# Patient Record
Sex: Female | Born: 1955 | Race: White | Hispanic: No | Smoking: Never smoker
Health system: Southern US, Community
[De-identification: ages and names within clinical notes are randomized; demographics above are authoritative.]

## PROBLEM LIST (undated history)

## (undated) DIAGNOSIS — R001 Bradycardia, unspecified: Secondary | ICD-10-CM

## (undated) DIAGNOSIS — R011 Cardiac murmur, unspecified: Secondary | ICD-10-CM

## (undated) DIAGNOSIS — I350 Nonrheumatic aortic (valve) stenosis: Secondary | ICD-10-CM

## (undated) DIAGNOSIS — K219 Gastro-esophageal reflux disease without esophagitis: Secondary | ICD-10-CM

## (undated) DIAGNOSIS — M1990 Unspecified osteoarthritis, unspecified site: Secondary | ICD-10-CM

## (undated) HISTORY — DX: Gastro-esophageal reflux disease without esophagitis: K21.9

---

## 1999-12-14 ENCOUNTER — Encounter: Payer: Self-pay | Admitting: Family Medicine

## 1999-12-14 ENCOUNTER — Encounter: Admission: RE | Admit: 1999-12-14 | Discharge: 1999-12-14 | Payer: Self-pay | Admitting: Family Medicine

## 2001-04-04 ENCOUNTER — Encounter: Admission: RE | Admit: 2001-04-04 | Discharge: 2001-04-04 | Payer: Self-pay | Admitting: *Deleted

## 2005-06-13 HISTORY — PX: REPLACEMENT TOTAL KNEE: SUR1224

## 2007-03-21 ENCOUNTER — Encounter: Admission: RE | Admit: 2007-03-21 | Discharge: 2007-03-21 | Payer: Self-pay | Admitting: Family Medicine

## 2008-04-21 ENCOUNTER — Encounter: Admission: RE | Admit: 2008-04-21 | Discharge: 2008-04-21 | Payer: Self-pay | Admitting: Internal Medicine

## 2008-12-19 ENCOUNTER — Inpatient Hospital Stay (HOSPITAL_COMMUNITY): Admission: RE | Admit: 2008-12-19 | Discharge: 2008-12-22 | Payer: Self-pay | Admitting: Orthopedic Surgery

## 2009-04-29 ENCOUNTER — Encounter: Admission: RE | Admit: 2009-04-29 | Discharge: 2009-04-29 | Payer: Self-pay | Admitting: Internal Medicine

## 2010-05-14 ENCOUNTER — Encounter: Admission: RE | Admit: 2010-05-14 | Discharge: 2010-05-14 | Payer: Self-pay | Admitting: Internal Medicine

## 2010-07-04 ENCOUNTER — Encounter: Payer: Self-pay | Admitting: Obstetrics and Gynecology

## 2010-08-06 IMAGING — CR DG KNEE 1-2V PORT*R*
2 series · 2 of 2 positions shown · non-contrast
Comparison: None.

CLINICAL DATA: Osteoarthritis, right total knee arthroplasty

PORTABLE RIGHT KNEE - 1-2 VIEW

[view not recorded (1 of 2)]
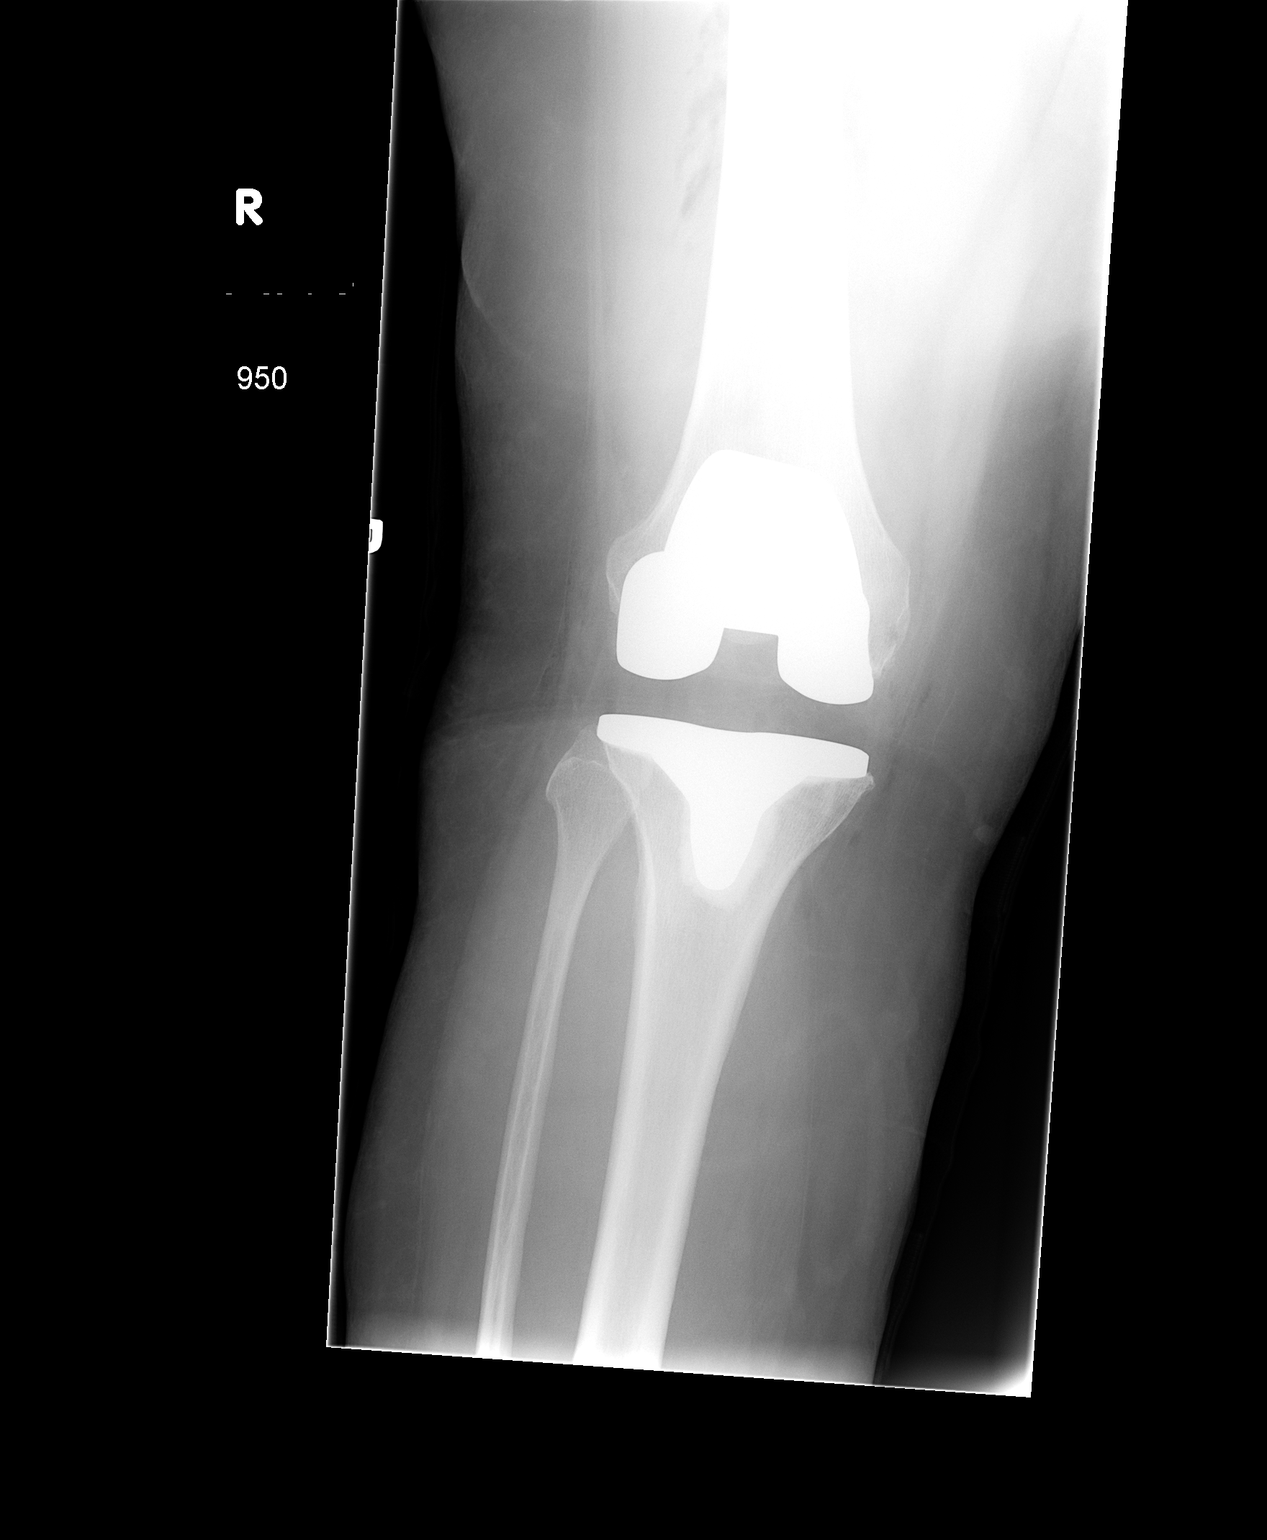

[view not recorded (2 of 2)]
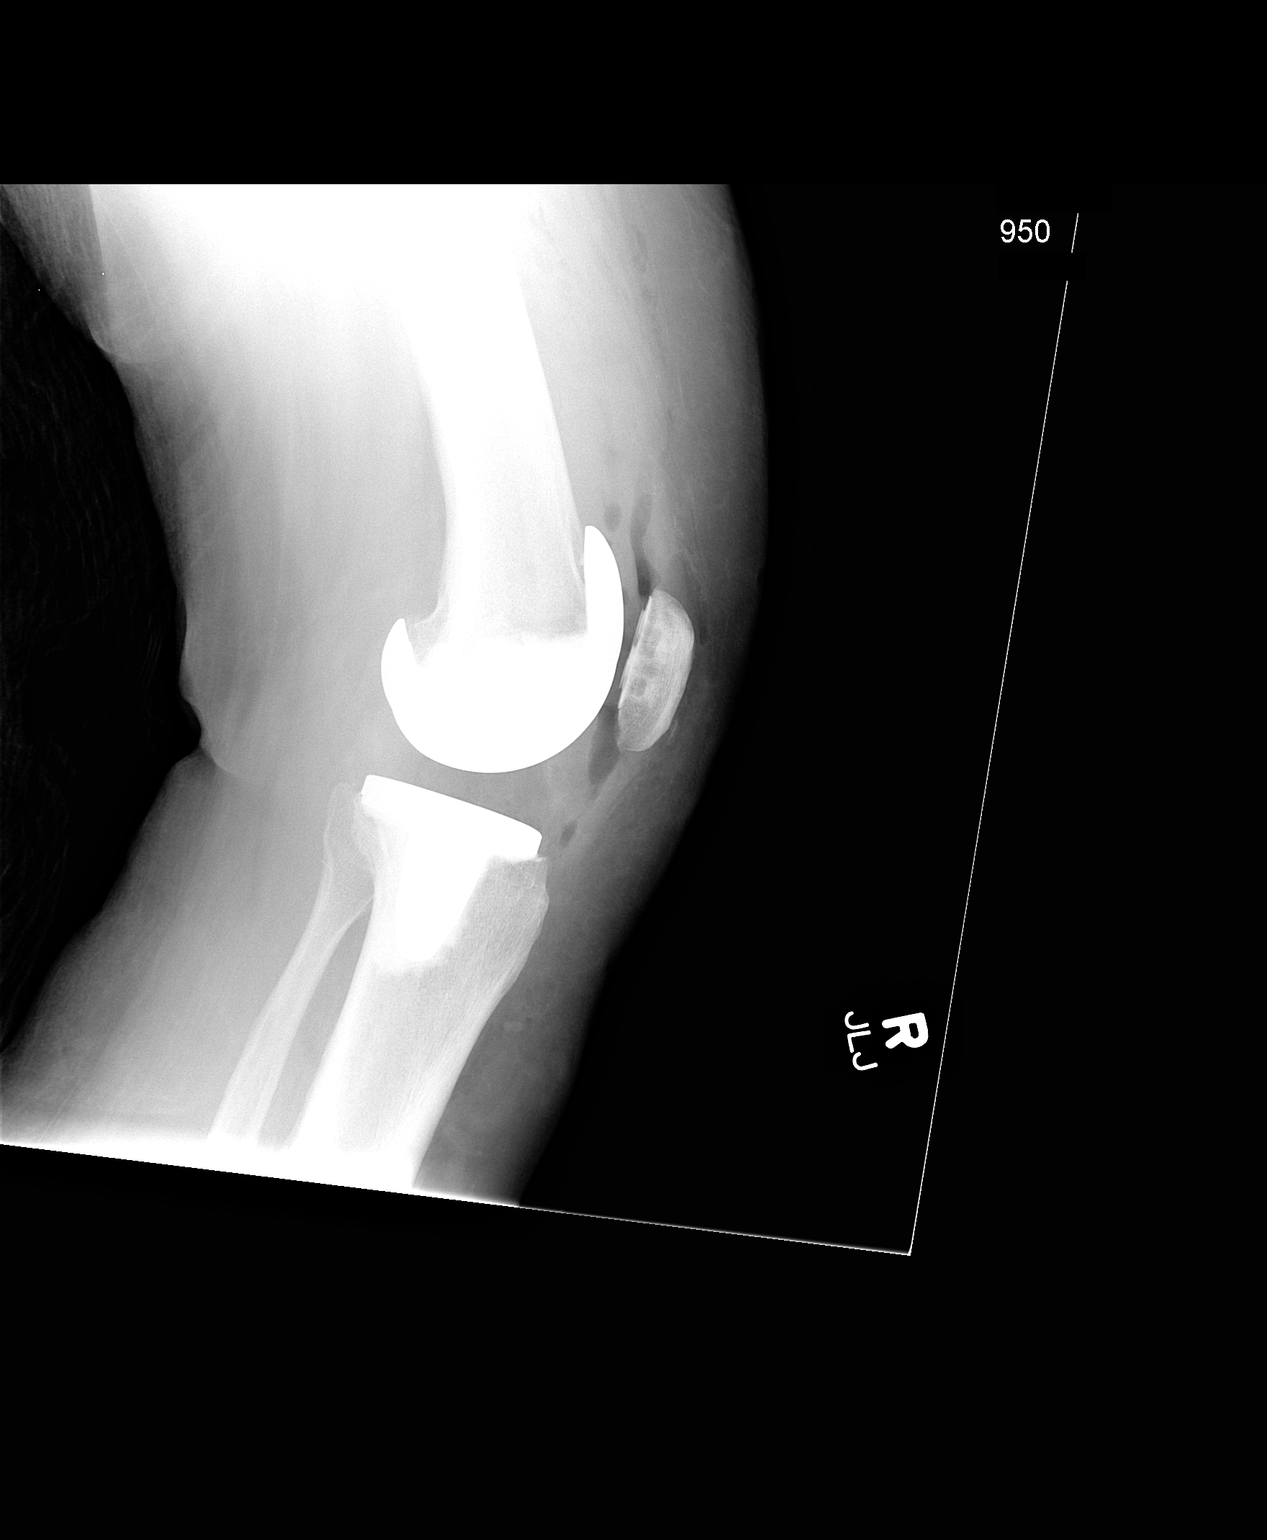

[2 of 2 positions shown; findings below may reference images not displayed]

FINDINGS: Portable two-view exam demonstrates bipolar arthroplasty
of the right knee.  Diffuse soft tissue swelling.  Air within the
joint space.  Normal alignment.  No fracture.  Expected
postoperative appearance.
IMPRESSION: Status post right total knee arthroplasty.

## 2010-09-19 LAB — BASIC METABOLIC PANEL
BUN: 6 mg/dL (ref 6–23)
CO2: 27 mEq/L (ref 19–32)
CO2: 28 mEq/L (ref 19–32)
CO2: 28 mEq/L (ref 19–32)
Chloride: 105 mEq/L (ref 96–112)
Chloride: 106 mEq/L (ref 96–112)
Chloride: 108 mEq/L (ref 96–112)
Creatinine, Ser: 0.72 mg/dL (ref 0.4–1.2)
Creatinine, Ser: 0.86 mg/dL (ref 0.4–1.2)
GFR calc Af Amer: >60 mL/min (ref >60)
GFR calc Af Amer: >60 mL/min (ref >60)
GFR calc Af Amer: >60 mL/min (ref >60)
GFR calc non Af Amer: >60 mL/min (ref >60)
GFR calc non Af Amer: >60 mL/min (ref >60)
Glucose, Bld: 108 mg/dL — ABNORMAL HIGH (ref 70–99)
Glucose, Bld: 146 mg/dL — ABNORMAL HIGH (ref 70–99)
Potassium: 3.6 mEq/L (ref 3.5–5.1)
Potassium: 3.9 mEq/L (ref 3.5–5.1)
Potassium: 4.3 mEq/L (ref 3.5–5.1)
Sodium: 135 mEq/L (ref 135–145)
Sodium: 139 mEq/L (ref 135–145)

## 2010-09-19 LAB — CBC
HCT: 31.6 % — ABNORMAL LOW (ref 36.0–46.0)
HCT: 33.1 % — ABNORMAL LOW (ref 36.0–46.0)
HCT: 35.8 % — ABNORMAL LOW (ref 36.0–46.0)
Hemoglobin: 11.4 g/dL — ABNORMAL LOW (ref 12.0–15.0)
Hemoglobin: 12 g/dL (ref 12.0–15.0)
MCHC: 33.5 g/dL (ref 30.0–36.0)
MCHC: 33.8 g/dL (ref 30.0–36.0)
MCHC: 34.5 g/dL (ref 30.0–36.0)
MCV: 94.6 fL (ref 78.0–100.0)
MCV: 95.2 fL (ref 78.0–100.0)
MCV: 95.3 fL (ref 78.0–100.0)
Platelets: 144 10*3/uL — ABNORMAL LOW (ref 150–400)
RBC: 3.32 MIL/uL — ABNORMAL LOW (ref 3.87–5.11)
RBC: 3.76 MIL/uL — ABNORMAL LOW (ref 3.87–5.11)
RBC: 4.31 MIL/uL (ref 3.87–5.11)
RDW: 13.4 % (ref 11.5–15.5)
RDW: 13.5 % (ref 11.5–15.5)
WBC: 5.1 10*3/uL (ref 4.0–10.5)
WBC: 7.2 10*3/uL (ref 4.0–10.5)

## 2010-09-19 LAB — URINE MICROSCOPIC-ADD ON

## 2010-09-19 LAB — URINALYSIS, ROUTINE W REFLEX MICROSCOPIC
Nitrite: NEGATIVE
Specific Gravity, Urine: 1.015 (ref 1.005–1.030)
Urobilinogen, UA: 0.2 mg/dL (ref 0.0–1.0)

## 2010-09-19 LAB — DIFFERENTIAL
Eosinophils Absolute: 0.2 10*3/uL (ref 0.0–0.7)
Lymphocytes Relative: 36 % (ref 12–46)
Lymphs Abs: 1.9 10*3/uL (ref 0.7–4.0)
Monocytes Relative: 8 % (ref 3–12)
Neutro Abs: 2.6 10*3/uL (ref 1.7–7.7)
Neutrophils Relative %: 50 % (ref 43–77)

## 2010-09-19 LAB — PROTIME-INR
INR: 0.9 (ref 0.00–1.49)
Prothrombin Time: 14.5 seconds (ref 11.6–15.2)

## 2010-09-19 LAB — APTT: aPTT: 22 seconds — ABNORMAL LOW (ref 24–37)

## 2010-10-26 NOTE — Op Note (Signed)
Kaitlyn Maynard, Kaitlyn Maynard            ACCOUNT NO.:  [id number]   MEDICAL RECORD NO.:  [id number]          PATIENT TYPE:  INP   LOCATION:  0001                         FACILITY:  [facility]   PHYSICIAN:  Almedia Balls. Ranell Patrick, M.D. DATE OF BIRTH:  03/07/1956   DATE OF PROCEDURE:  12/19/2008  DATE OF DISCHARGE:                               OPERATIVE REPORT   PREOPERATIVE DIAGNOSIS:  Right knee end-stage osteoarthritis.   POSTOPERATIVE DIAGNOSIS:  Right knee end-stage osteoarthritis.   PROCEDURE PERFORMED:  Right total knee replacement using DePuy segmental  rotating platform prosthesis.   SURGEON:  Almedia Balls. Ranell Patrick, M.D.   ASSISTANT:  Donnie Coffin. Durwin Nora, P.A.   ANESTHESIA:  Spinal anesthesia was used.   ESTIMATED BLOOD LOSS:  Was minimal.   TOURNIQUET TIME:  75 minutes at 350 mmHg.   FLUID REPLACEMENT:  2300 mL crystalloid.   URINE OUTPUT:  375 mL.   COUNTS:  Correct.   COMPLICATIONS:  None.  Perioperative antibiotics were given.   INDICATIONS:  The patient is a 55 year old female with worsening right  knee pain secondary to osteoarthritis.  The patient has failed  conservative management and presents now desiring operative treatment to  restore function and eliminate pain.  Informed consent was obtained.   DESCRIPTION OF PROCEDURE:  After an adequate level of anesthesia was  achieved the patient was positioned supine on the operating room table.  Nonsterile tourniquet was placed on right proximal thigh.  Right leg was  sterilely prepped and draped in the usual manner.  After exsanguination  of the limb using Esmarch bandage we elevated the tourniquet to 350  mmHg.  A longitudinal midline incision was created at the knee flexion  using a 10 blade scalpel.  Dissection sharply down through subcutaneous  tissues.  Median parapatellar arthrotomy created.  Lateral  patellofemoral ligaments divided.  Patella was everted.  Knee was  flexed.  Distal femur was entered using a step-cut drill.   We then  entered the distal femur with an intermedullary guide for distal femoral  resection set on 5 degrees right and 10 mm resection.  We then sized the  femur to a size 2.5 and then placed our four in one cutting block with  attention towards appropriate internal and external rotation based on  the epicondylar axis and posterior femoral condylar referencing.  We  placed our 2.5 cutting block on and performed our anterior-posterior and  chamfer cut blocks making sure that we did not notch the femur.  At this  point we divided our ACL and PCL and remaining meniscal tissue.  We then  subluxed the tibia anteriorly and cut the tibia at 90 degrees  perpendicular to the long axis of the tibia shaft with 2 mm resection  off the affected medial side.  At this point we checked our  flexion/extension gaps.  These were symmetric at at least 10 mm.  We  then removed our pins from the tibia and finished our tibial preparation  with placement of the, using the modular drill and keel punch and then  placed our trial 2.5 tibia in place.  We then  resected the extra bone  off the posterior femoral condyles and went ahead and performed our box  cut using the box cut resection guide and then impacted our 2.5 femur in  place.  We trialed with a 12.5 and 15 insert and the 15 fit perfectly.  We then resurfaced the patella starting at 22 mL thickness and going  down to about 15 mm of thickness and placing a 32 patellar button in  place after drilling the lug holes.  We had a nice patellar tracking and  excellent stability in flexion and extension with the ability to easily  gain full extension.  We then removed all trial implants.  We bone  grafted the distal end of the femur plugging the hole with available  bone.  We pulse irrigated the femur and tibia and patella and then went  ahead and cemented the components in place using DePuy high viscosity  cement.  Once the cement was allowed to harden we removed  excess cement  using quarter and curved osteotomes.  We then went ahead and trialed  again with 15 mm insert and that gave [place] excellent stability in full  extension and normal patellar tracking.  We then placed the real 15 mm  insert, thoroughly irrigated the knee and then closed the knee using #1  Vicryl suture figure-of-eight for the parapatellar arthrotomy, 2-0  Vicryl for subcutaneous closure and 4-0 Monocryl for skin.  Steri-Strips  applied followed by sterile dressing.  The patient tolerated the surgery  well.      Almedia Balls. Ranell Patrick, M.D.  Electronically Signed     SRN/MEDQ  D:  12/19/2008  T:  12/19/2008  Job:  [id number]

## 2010-10-26 NOTE — Discharge Summary (Signed)
Kaitlyn Maynard, Kaitlyn Maynard            ACCOUNT NO.:  [id number]   MEDICAL RECORD NO.:  [id number]          PATIENT TYPE:  INP   LOCATION:  1604                         [facility]ACILITY:  [facility]   PHYSICIAN:  Almedia Balls. Ranell Patrick, M.D. DATE OF BIRTH:  1956/04/12   DATE OF ADMISSION:  12/19/2008  DATE OF DISCHARGE:  12/22/2008                               DISCHARGE SUMMARY   ADMISSION DIAGNOSIS:  Right knee end-stage osteoarthritis.   DISCHARGE DIAGNOSES:  1. Right knee end-stage osteoarthritis.  2. Status post total knee arthroplasty.   BRIEF HISTORY:  The patient is a 55 year old female with worsening right  knee pain that has been refractory to conservative treatment.  The  patient elected for surgical management for pain relief.   PROCEDURE:  The patient had a right total knee arthroplasty by Dr.  Malon Kindle on December 19, 2008.  Assistant was [employer].  Spinal  anesthesia was used.  No complications.   HOSPITAL COURSE:  The patient admitted on December 19, 2008 for the above-  stated procedure which she tolerated well after adequate time in post  anesthesia care unit.  She was transferred up to [facility].  On postop day  #1, the patient complained of minimal pain, was able to work with her  CPM quite well.  Labs within acceptable limits.  Postop day #2, she  complained about increased pain, but it was under control in her pain  medicine.  She was actually able to work physical therapy and do some  gentle walking.  Postop day #3, she was already walking up and down the  halls.  Labs were within normal limits.  No signs of wound infection  after dressing change.  Neurovascularly , she is intact and doing quite  well.   DISCHARGE/PLAN:  The patient will be discharged home on December 22, 2008.  Her condition is stable.  Her diet is regular.  She has no known drug  allergies.   DISCHARGE MEDICATIONS:  1. Celebrex 200 mg p.o. daily.  2. Robaxin 500 mg p.o. q.6 h.  3. Coumadin per pharmacy  protocol.  4. Percocet 5/325 1-2 tablets q. 4-6 hours p.r.n. pain.   FOLLOW UP:  The patient will follow back up with Dr. Malon Kindle in  two weeks.      Thomas B. Durwin Nora, P.A.      Almedia Balls. Ranell Patrick, M.D.  Electronically Signed    TBD/MEDQ  D:  12/22/2008  T:  12/22/2008  Job:  [id number]

## 2010-10-29 NOTE — H&P (Signed)
Kaitlyn Maynard, Kaitlyn Maynard            ACCOUNT NO.:  [id number]   MEDICAL RECORD NO.:  [id number]          PATIENT TYPE:  INP   LOCATION:  NA                           FACILITY:  [facility]   PHYSICIAN:  Almedia Balls. Ranell Patrick, M.D. DATE OF BIRTH:  1956-03-14   DATE OF ADMISSION:  DATE OF DISCHARGE:                              HISTORY & PHYSICAL   CHIEF COMPLAINT:  Right knee pain.   HISTORY OF PRESENT ILLNESS:  The patient 55 year old female with  worsening right knee pain that has been refractory to conservative  treatment secondary to osteoarthritis.  The patient elected to have a  right total knee arthroplasty by Dr. Malon Kindle.   PAST MEDICAL HISTORY:  Bronchitis and osteoarthritis.   FAMILY HISTORY:  Diabetes.   SOCIAL HISTORY:  A patient of Dr. Ronne Binning.  Does not smoke or use  alcohol.   ALLERGIES:  None.   CURRENT MEDICATIONS:  Prilosec daily.   REVIEW OF SYSTEMS:  Pain with ambulation.   PHYSICAL EXAM:  Pulse 60, respirations 16, blood pressure 138/82.  The patient is a healthy-appearing 55 year old female in no acute  distress.  Pleasant mood and affect.  Alert and oriented x3.  HEAD AND NECK EXAM:  Cranial nerves II-XII grossly intact.  She has full  range of motion without any tenderness of the cervical spine.  CHEST:  Active breath sounds bilaterally.  No wheezes, rhonchi or rales.  HEART:  Shows a regular rate and rhythm with no murmur.  ABDOMEN:  Nontender, nondistended with active bowel sounds.  EXTREMITIES:  Show moderate tenderness to the right knee, especially the  medial joint line, with crepitus.  She does have an antalgic gait.  NEUROVASCULAR:  She is intact distally with no edema.  No rashes.   X-rays show end-stage osteoarthritis of the right knee.   IMPRESSION:  End-stage osteoarthritis, right knee.   PLAN OF ACTION:  Right total knee arthroplasty by Dr. Malon Kindle.      Thomas B. Durwin Nora, P.A.      Almedia Balls. Ranell Patrick, M.D.  Electronically  Signed    TBD/MEDQ  D:  11/27/2008  T:  11/27/2008  Job:  [id number]

## 2011-06-24 ENCOUNTER — Other Ambulatory Visit: Payer: Self-pay | Admitting: Internal Medicine

## 2011-06-24 DIAGNOSIS — Z1231 Encounter for screening mammogram for malignant neoplasm of breast: Secondary | ICD-10-CM

## 2011-07-07 ENCOUNTER — Ambulatory Visit
Admission: RE | Admit: 2011-07-07 | Discharge: 2011-07-07 | Disposition: A | Discharge status: Home / Self Care | Payer: BC Managed Care – PPO | Source: Ambulatory Visit | Attending: Internal Medicine | Admitting: Internal Medicine

## 2011-07-07 DIAGNOSIS — Z1231 Encounter for screening mammogram for malignant neoplasm of breast: Secondary | ICD-10-CM

## 2012-07-02 ENCOUNTER — Other Ambulatory Visit: Payer: Self-pay | Admitting: Internal Medicine

## 2012-07-02 DIAGNOSIS — Z1231 Encounter for screening mammogram for malignant neoplasm of breast: Secondary | ICD-10-CM

## 2012-07-26 ENCOUNTER — Ambulatory Visit: Payer: BC Managed Care – PPO

## 2012-08-16 ENCOUNTER — Ambulatory Visit: Payer: BC Managed Care – PPO

## 2012-09-14 ENCOUNTER — Ambulatory Visit: Payer: BC Managed Care – PPO

## 2012-10-12 ENCOUNTER — Ambulatory Visit: Payer: BC Managed Care – PPO

## 2012-10-19 ENCOUNTER — Ambulatory Visit
Admission: RE | Admit: 2012-10-19 | Discharge: 2012-10-19 | Disposition: A | Discharge status: Home / Self Care | Payer: BC Managed Care – PPO | Source: Ambulatory Visit | Attending: Internal Medicine | Admitting: Internal Medicine

## 2012-10-19 DIAGNOSIS — Z1231 Encounter for screening mammogram for malignant neoplasm of breast: Secondary | ICD-10-CM

## 2014-03-21 ENCOUNTER — Other Ambulatory Visit: Payer: Self-pay

## 2014-03-21 DIAGNOSIS — Z1239 Encounter for other screening for malignant neoplasm of breast: Secondary | ICD-10-CM

## 2014-03-31 ENCOUNTER — Ambulatory Visit
Admission: RE | Admit: 2014-03-31 | Discharge: 2014-03-31 | Disposition: A | Discharge status: Home / Self Care | Payer: BC Managed Care – PPO | Source: Ambulatory Visit

## 2014-03-31 DIAGNOSIS — Z1239 Encounter for other screening for malignant neoplasm of breast: Secondary | ICD-10-CM

## 2014-05-05 ENCOUNTER — Other Ambulatory Visit: Payer: Self-pay | Admitting: Obstetrics and Gynecology

## 2014-05-05 DIAGNOSIS — Z780 Asymptomatic menopausal state: Secondary | ICD-10-CM

## 2014-05-15 ENCOUNTER — Other Ambulatory Visit: Payer: BC Managed Care – PPO

## 2014-05-21 ENCOUNTER — Ambulatory Visit
Admission: RE | Admit: 2014-05-21 | Discharge: 2014-05-21 | Disposition: A | Discharge status: Home / Self Care | Payer: BC Managed Care – PPO | Source: Ambulatory Visit | Attending: Obstetrics and Gynecology | Admitting: Obstetrics and Gynecology

## 2014-05-21 DIAGNOSIS — Z780 Asymptomatic menopausal state: Secondary | ICD-10-CM

## 2015-03-16 ENCOUNTER — Other Ambulatory Visit: Payer: Self-pay

## 2015-03-16 DIAGNOSIS — Z1231 Encounter for screening mammogram for malignant neoplasm of breast: Secondary | ICD-10-CM

## 2015-04-03 ENCOUNTER — Ambulatory Visit
Admission: RE | Admit: 2015-04-03 | Discharge: 2015-04-03 | Disposition: A | Discharge status: Home / Self Care | Payer: 59 | Source: Ambulatory Visit

## 2015-04-03 DIAGNOSIS — Z1231 Encounter for screening mammogram for malignant neoplasm of breast: Secondary | ICD-10-CM

## 2019-04-30 ENCOUNTER — Other Ambulatory Visit: Payer: Self-pay | Admitting: Internal Medicine

## 2019-04-30 DIAGNOSIS — Z1231 Encounter for screening mammogram for malignant neoplasm of breast: Secondary | ICD-10-CM

## 2019-05-18 DIAGNOSIS — R519 Headache, unspecified: Secondary | ICD-10-CM | POA: Diagnosis not present

## 2019-05-18 DIAGNOSIS — J069 Acute upper respiratory infection, unspecified: Secondary | ICD-10-CM | POA: Diagnosis not present

## 2019-05-18 DIAGNOSIS — R509 Fever, unspecified: Secondary | ICD-10-CM | POA: Diagnosis not present

## 2019-05-23 DIAGNOSIS — B029 Zoster without complications: Secondary | ICD-10-CM | POA: Diagnosis not present

## 2019-05-23 DIAGNOSIS — M549 Dorsalgia, unspecified: Secondary | ICD-10-CM | POA: Diagnosis not present

## 2019-06-04 ENCOUNTER — Ambulatory Visit: Payer: Self-pay

## 2019-07-02 DIAGNOSIS — H2512 Age-related nuclear cataract, left eye: Secondary | ICD-10-CM | POA: Diagnosis not present

## 2019-07-02 DIAGNOSIS — H2513 Age-related nuclear cataract, bilateral: Secondary | ICD-10-CM | POA: Diagnosis not present

## 2019-07-02 DIAGNOSIS — H30032 Focal chorioretinal inflammation, peripheral, left eye: Secondary | ICD-10-CM | POA: Diagnosis not present

## 2019-07-02 DIAGNOSIS — H25043 Posterior subcapsular polar age-related cataract, bilateral: Secondary | ICD-10-CM | POA: Diagnosis not present

## 2019-07-02 DIAGNOSIS — H43813 Vitreous degeneration, bilateral: Secondary | ICD-10-CM | POA: Diagnosis not present

## 2019-07-02 DIAGNOSIS — H18413 Arcus senilis, bilateral: Secondary | ICD-10-CM | POA: Diagnosis not present

## 2019-07-02 DIAGNOSIS — H35372 Puckering of macula, left eye: Secondary | ICD-10-CM | POA: Diagnosis not present

## 2019-07-02 DIAGNOSIS — H25013 Cortical age-related cataract, bilateral: Secondary | ICD-10-CM | POA: Diagnosis not present

## 2019-07-04 DIAGNOSIS — Z1159 Encounter for screening for other viral diseases: Secondary | ICD-10-CM | POA: Diagnosis not present

## 2019-07-04 DIAGNOSIS — Z0001 Encounter for general adult medical examination with abnormal findings: Secondary | ICD-10-CM | POA: Diagnosis not present

## 2019-07-04 DIAGNOSIS — E7800 Pure hypercholesterolemia, unspecified: Secondary | ICD-10-CM | POA: Diagnosis not present

## 2019-07-04 DIAGNOSIS — R011 Cardiac murmur, unspecified: Secondary | ICD-10-CM | POA: Diagnosis not present

## 2019-07-04 DIAGNOSIS — Z79899 Other long term (current) drug therapy: Secondary | ICD-10-CM | POA: Diagnosis not present

## 2019-07-05 DIAGNOSIS — H20049 Secondary noninfectious iridocyclitis, unspecified eye: Secondary | ICD-10-CM | POA: Diagnosis not present

## 2019-07-05 DIAGNOSIS — H44112 Panuveitis, left eye: Secondary | ICD-10-CM | POA: Diagnosis not present

## 2019-07-05 DIAGNOSIS — H3090 Unspecified chorioretinal inflammation, unspecified eye: Secondary | ICD-10-CM | POA: Diagnosis not present

## 2019-07-08 DIAGNOSIS — I361 Nonrheumatic tricuspid (valve) insufficiency: Secondary | ICD-10-CM | POA: Diagnosis not present

## 2019-07-08 DIAGNOSIS — R011 Cardiac murmur, unspecified: Secondary | ICD-10-CM | POA: Diagnosis not present

## 2019-07-08 DIAGNOSIS — I350 Nonrheumatic aortic (valve) stenosis: Secondary | ICD-10-CM | POA: Diagnosis not present

## 2019-07-15 ENCOUNTER — Other Ambulatory Visit: Payer: Self-pay

## 2019-07-15 ENCOUNTER — Ambulatory Visit
Admission: RE | Admit: 2019-07-15 | Discharge: 2019-07-15 | Disposition: A | Discharge status: Home / Self Care | Payer: Medicare Other | Source: Ambulatory Visit | Attending: Internal Medicine | Admitting: Internal Medicine

## 2019-07-15 DIAGNOSIS — Z1231 Encounter for screening mammogram for malignant neoplasm of breast: Secondary | ICD-10-CM

## 2019-07-17 DIAGNOSIS — H35372 Puckering of macula, left eye: Secondary | ICD-10-CM | POA: Diagnosis not present

## 2019-07-17 DIAGNOSIS — H30032 Focal chorioretinal inflammation, peripheral, left eye: Secondary | ICD-10-CM | POA: Diagnosis not present

## 2019-07-17 DIAGNOSIS — H43813 Vitreous degeneration, bilateral: Secondary | ICD-10-CM | POA: Diagnosis not present

## 2019-07-30 DIAGNOSIS — H43392 Other vitreous opacities, left eye: Secondary | ICD-10-CM | POA: Diagnosis not present

## 2019-07-30 DIAGNOSIS — H43813 Vitreous degeneration, bilateral: Secondary | ICD-10-CM | POA: Diagnosis not present

## 2019-07-30 DIAGNOSIS — H35372 Puckering of macula, left eye: Secondary | ICD-10-CM | POA: Diagnosis not present

## 2019-07-30 DIAGNOSIS — H30032 Focal chorioretinal inflammation, peripheral, left eye: Secondary | ICD-10-CM | POA: Diagnosis not present

## 2019-07-31 DIAGNOSIS — Z1212 Encounter for screening for malignant neoplasm of rectum: Secondary | ICD-10-CM | POA: Diagnosis not present

## 2019-07-31 DIAGNOSIS — Z1211 Encounter for screening for malignant neoplasm of colon: Secondary | ICD-10-CM | POA: Diagnosis not present

## 2019-08-04 LAB — COLOGUARD: COLOGUARD: NEGATIVE

## 2019-08-05 DIAGNOSIS — E7800 Pure hypercholesterolemia, unspecified: Secondary | ICD-10-CM | POA: Diagnosis not present

## 2019-08-05 DIAGNOSIS — K219 Gastro-esophageal reflux disease without esophagitis: Secondary | ICD-10-CM | POA: Diagnosis not present

## 2019-09-24 DIAGNOSIS — H43813 Vitreous degeneration, bilateral: Secondary | ICD-10-CM | POA: Diagnosis not present

## 2019-09-24 DIAGNOSIS — H2513 Age-related nuclear cataract, bilateral: Secondary | ICD-10-CM | POA: Diagnosis not present

## 2019-09-24 DIAGNOSIS — H35372 Puckering of macula, left eye: Secondary | ICD-10-CM | POA: Diagnosis not present

## 2019-09-24 DIAGNOSIS — H30032 Focal chorioretinal inflammation, peripheral, left eye: Secondary | ICD-10-CM | POA: Diagnosis not present

## 2019-10-12 HISTORY — PX: CATARACT EXTRACTION: SUR2

## 2019-10-23 DIAGNOSIS — H2512 Age-related nuclear cataract, left eye: Secondary | ICD-10-CM | POA: Diagnosis not present

## 2019-10-24 DIAGNOSIS — H2511 Age-related nuclear cataract, right eye: Secondary | ICD-10-CM | POA: Diagnosis not present

## 2019-11-06 DIAGNOSIS — H2511 Age-related nuclear cataract, right eye: Secondary | ICD-10-CM | POA: Diagnosis not present

## 2020-04-16 DIAGNOSIS — H2513 Age-related nuclear cataract, bilateral: Secondary | ICD-10-CM | POA: Diagnosis not present

## 2020-04-16 DIAGNOSIS — H40033 Anatomical narrow angle, bilateral: Secondary | ICD-10-CM | POA: Diagnosis not present

## 2020-05-15 DIAGNOSIS — H10023 Other mucopurulent conjunctivitis, bilateral: Secondary | ICD-10-CM | POA: Diagnosis not present

## 2020-06-13 HISTORY — PX: EYE SURGERY: SHX253

## 2020-08-14 DIAGNOSIS — Q253 Supravalvular aortic stenosis: Secondary | ICD-10-CM | POA: Diagnosis not present

## 2020-08-14 DIAGNOSIS — Z0001 Encounter for general adult medical examination with abnormal findings: Secondary | ICD-10-CM | POA: Diagnosis not present

## 2020-08-14 DIAGNOSIS — Z1231 Encounter for screening mammogram for malignant neoplasm of breast: Secondary | ICD-10-CM | POA: Diagnosis not present

## 2020-08-14 DIAGNOSIS — E7800 Pure hypercholesterolemia, unspecified: Secondary | ICD-10-CM | POA: Diagnosis not present

## 2020-08-14 DIAGNOSIS — Z79899 Other long term (current) drug therapy: Secondary | ICD-10-CM | POA: Diagnosis not present

## 2020-08-14 DIAGNOSIS — I358 Other nonrheumatic aortic valve disorders: Secondary | ICD-10-CM | POA: Diagnosis not present

## 2020-08-14 DIAGNOSIS — K219 Gastro-esophageal reflux disease without esophagitis: Secondary | ICD-10-CM | POA: Diagnosis not present

## 2020-11-03 ENCOUNTER — Other Ambulatory Visit: Payer: Self-pay | Admitting: Internal Medicine

## 2020-11-03 DIAGNOSIS — Z1231 Encounter for screening mammogram for malignant neoplasm of breast: Secondary | ICD-10-CM

## 2020-12-11 DIAGNOSIS — H43393 Other vitreous opacities, bilateral: Secondary | ICD-10-CM | POA: Diagnosis not present

## 2020-12-31 ENCOUNTER — Ambulatory Visit: Payer: Medicare Other

## 2021-01-08 ENCOUNTER — Ambulatory Visit
Admission: RE | Admit: 2021-01-08 | Discharge: 2021-01-08 | Disposition: A | Discharge status: Home / Self Care | Payer: Medicare Other | Source: Ambulatory Visit | Attending: Internal Medicine | Admitting: Internal Medicine

## 2021-01-08 ENCOUNTER — Other Ambulatory Visit: Payer: Self-pay

## 2021-01-08 DIAGNOSIS — Z1231 Encounter for screening mammogram for malignant neoplasm of breast: Secondary | ICD-10-CM | POA: Diagnosis not present

## 2021-02-05 DIAGNOSIS — H43393 Other vitreous opacities, bilateral: Secondary | ICD-10-CM | POA: Diagnosis not present

## 2021-03-01 IMAGING — MG DIGITAL SCREENING BILAT W/ CAD
4 series · 4 of 4 positions shown · non-contrast
Comparison: Previous exam(s).

CLINICAL DATA: Screening.

EXAM:
DIGITAL SCREENING BILATERAL MAMMOGRAM WITH CAD

[L MLO]
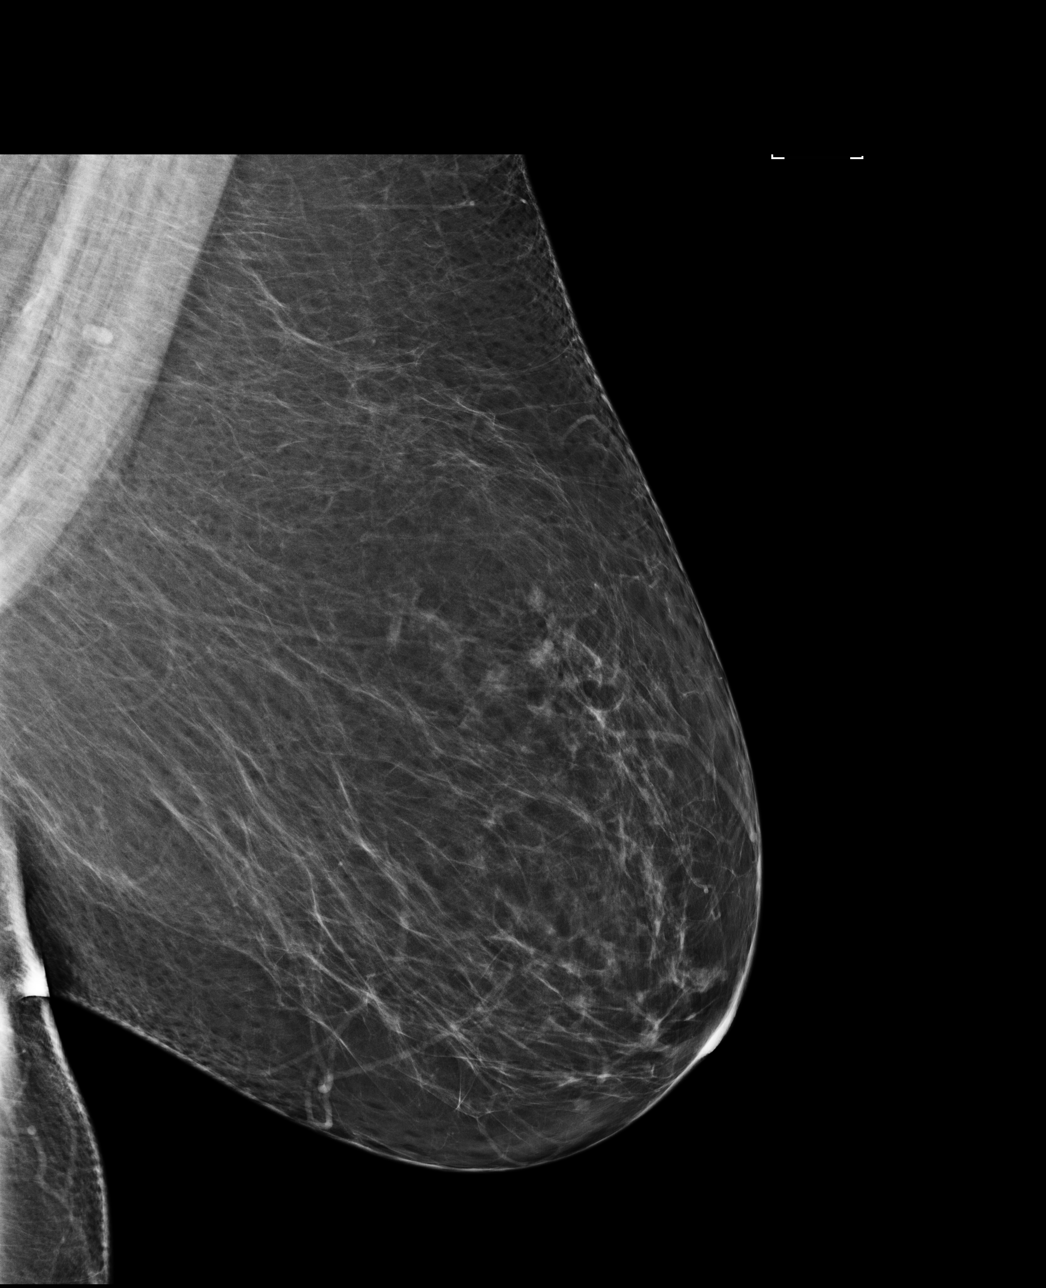

[L CC]
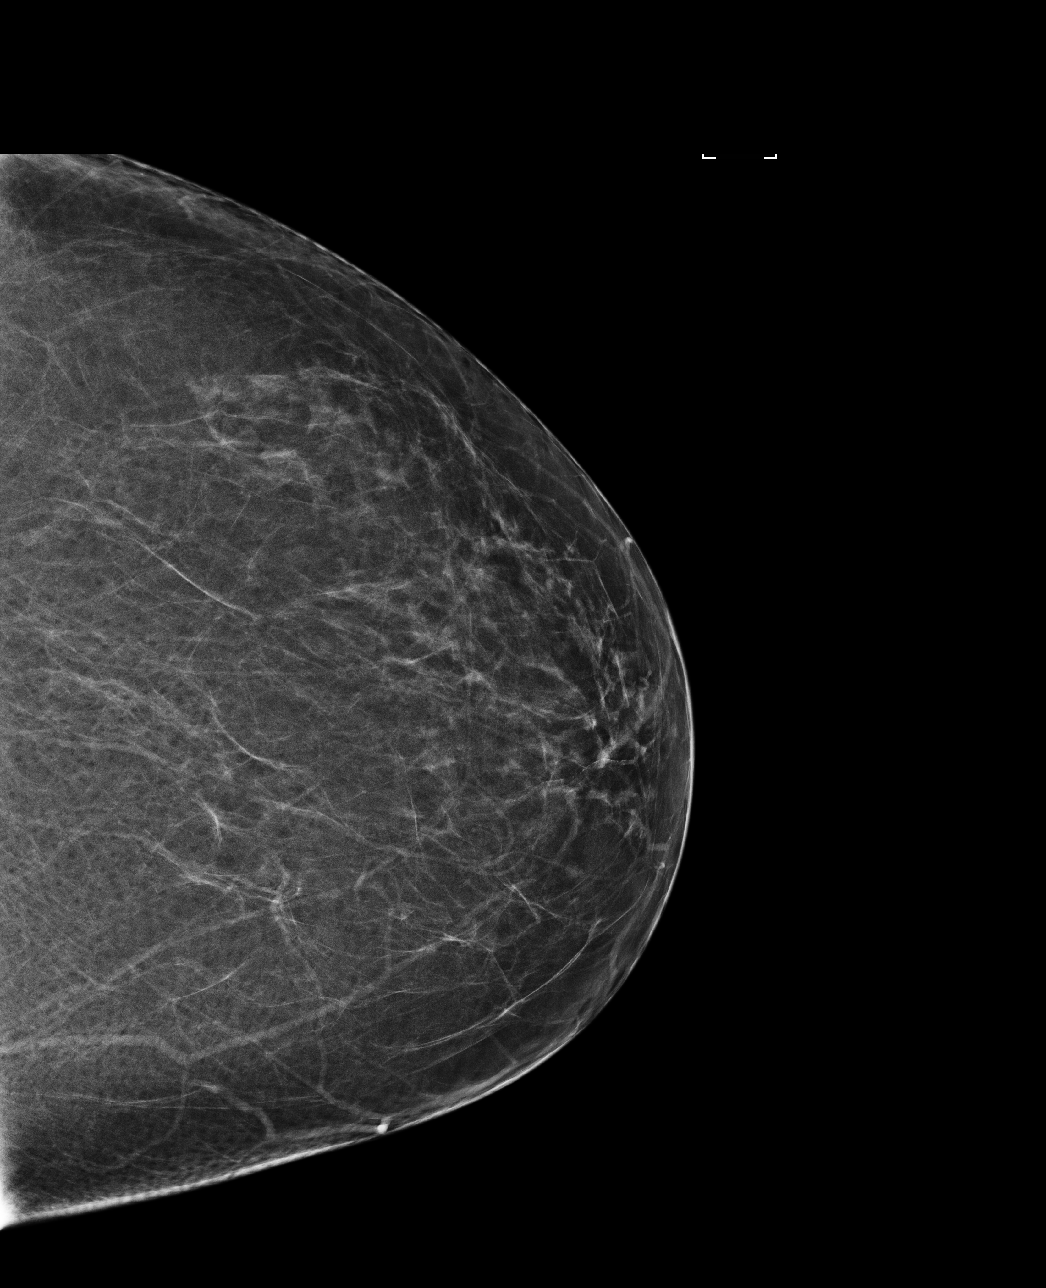

[R CC]
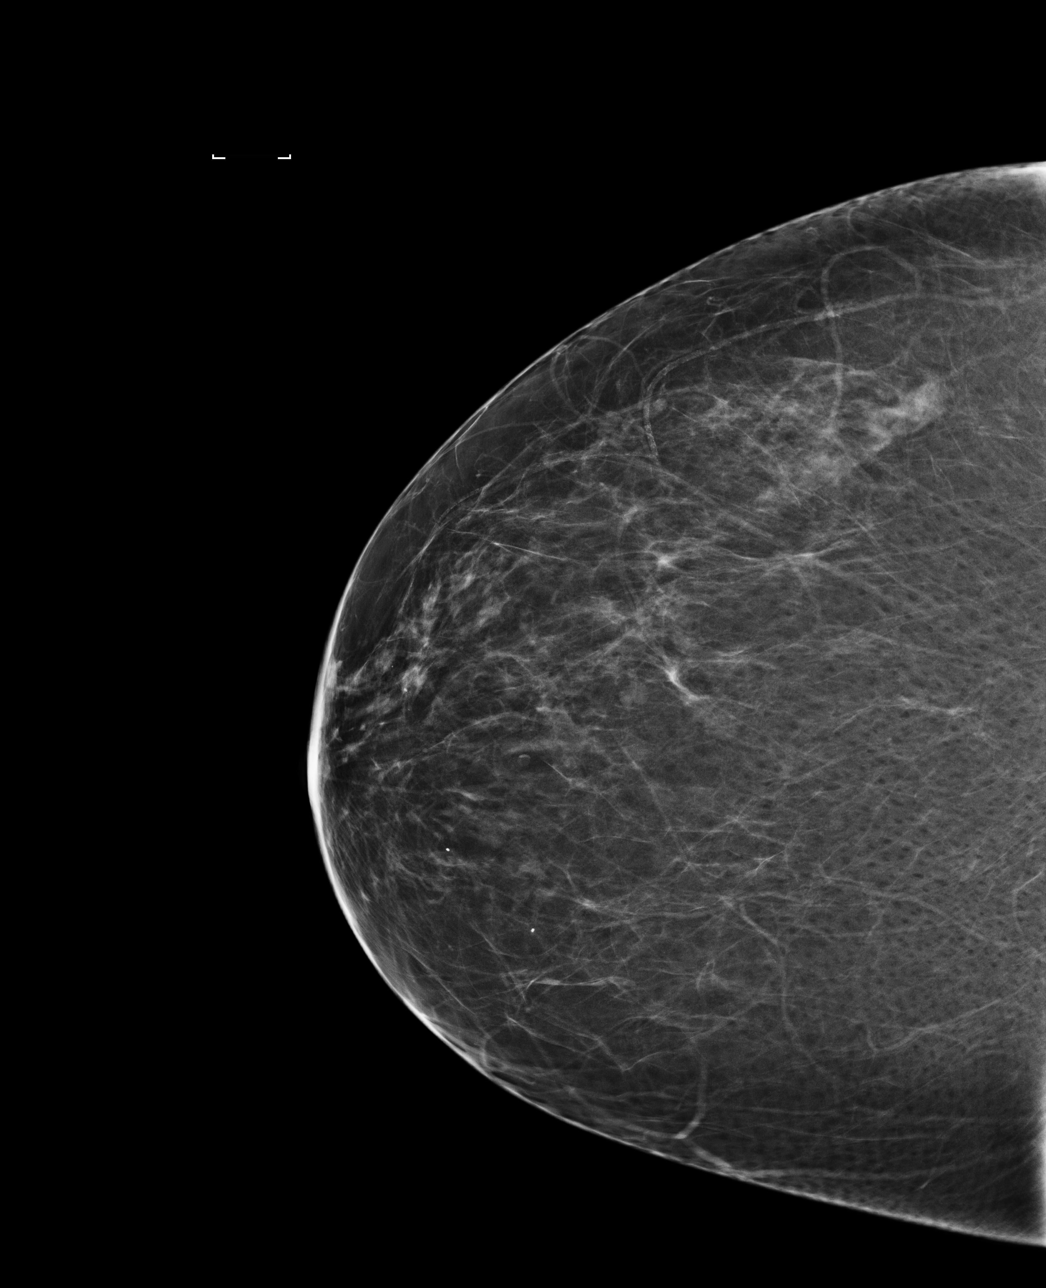

[R MLO]
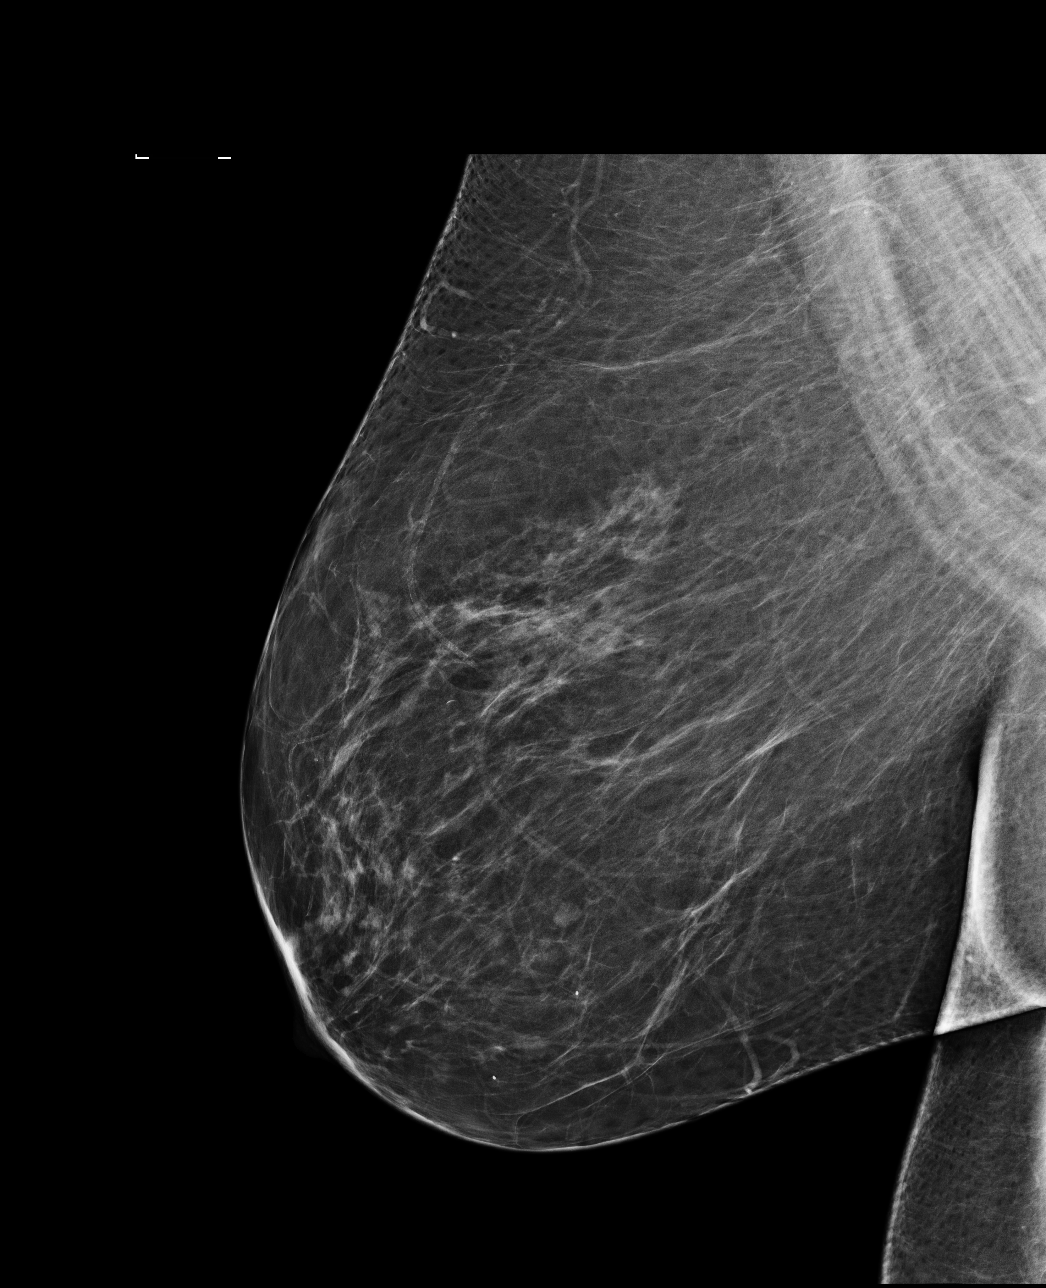

[4 of 4 positions shown; findings below may reference images not displayed]

ACR Breast Density Category b: There are scattered areas of
fibroglandular density.
FINDINGS: There are no findings suspicious for malignancy. Images were
processed with CAD.
IMPRESSION: No mammographic evidence of malignancy. A result letter of this
screening mammogram will be mailed directly to the patient.

RECOMMENDATION:
Screening mammogram in one year. (Code:AS-G-LCT)

BI-RADS CATEGORY  1: Negative.

## 2021-03-25 DIAGNOSIS — H26492 Other secondary cataract, left eye: Secondary | ICD-10-CM | POA: Diagnosis not present

## 2021-03-25 DIAGNOSIS — Z961 Presence of intraocular lens: Secondary | ICD-10-CM | POA: Diagnosis not present

## 2021-03-25 DIAGNOSIS — H43392 Other vitreous opacities, left eye: Secondary | ICD-10-CM | POA: Diagnosis not present

## 2021-03-25 DIAGNOSIS — H18413 Arcus senilis, bilateral: Secondary | ICD-10-CM | POA: Diagnosis not present

## 2021-04-07 DIAGNOSIS — H04123 Dry eye syndrome of bilateral lacrimal glands: Secondary | ICD-10-CM | POA: Diagnosis not present

## 2021-05-10 DIAGNOSIS — R051 Acute cough: Secondary | ICD-10-CM | POA: Diagnosis not present

## 2021-05-10 DIAGNOSIS — R519 Headache, unspecified: Secondary | ICD-10-CM | POA: Diagnosis not present

## 2021-08-20 DIAGNOSIS — E7800 Pure hypercholesterolemia, unspecified: Secondary | ICD-10-CM | POA: Diagnosis not present

## 2021-08-20 DIAGNOSIS — K219 Gastro-esophageal reflux disease without esophagitis: Secondary | ICD-10-CM | POA: Diagnosis not present

## 2021-08-20 DIAGNOSIS — H43393 Other vitreous opacities, bilateral: Secondary | ICD-10-CM | POA: Diagnosis not present

## 2021-08-20 DIAGNOSIS — R001 Bradycardia, unspecified: Secondary | ICD-10-CM | POA: Diagnosis not present

## 2021-08-20 DIAGNOSIS — Z0001 Encounter for general adult medical examination with abnormal findings: Secondary | ICD-10-CM | POA: Diagnosis not present

## 2021-08-20 DIAGNOSIS — Q253 Supravalvular aortic stenosis: Secondary | ICD-10-CM | POA: Diagnosis not present

## 2021-08-20 DIAGNOSIS — Z79899 Other long term (current) drug therapy: Secondary | ICD-10-CM | POA: Diagnosis not present

## 2021-08-27 ENCOUNTER — Encounter: Payer: Self-pay | Admitting: Cardiology

## 2021-08-27 DIAGNOSIS — K219 Gastro-esophageal reflux disease without esophagitis: Secondary | ICD-10-CM | POA: Insufficient documentation

## 2021-08-27 HISTORY — DX: Gastro-esophageal reflux disease without esophagitis: K21.9

## 2021-09-28 DIAGNOSIS — H02831 Dermatochalasis of right upper eyelid: Secondary | ICD-10-CM | POA: Diagnosis not present

## 2021-09-28 DIAGNOSIS — H18413 Arcus senilis, bilateral: Secondary | ICD-10-CM | POA: Diagnosis not present

## 2021-09-28 DIAGNOSIS — Z961 Presence of intraocular lens: Secondary | ICD-10-CM | POA: Diagnosis not present

## 2021-09-28 DIAGNOSIS — H26491 Other secondary cataract, right eye: Secondary | ICD-10-CM | POA: Diagnosis not present

## 2021-09-28 HISTORY — PX: EYE SURGERY: SHX253

## 2021-09-30 NOTE — Progress Notes (Signed)
?Cardiology Office Note:   ? ?Date:  10/01/2021  ? ?ID:  Kaitlyn Maynard, DOB Jul 15, 1955, MRN [id number] ? ?PCP:  Rolm Gala, NP  ?Cardiologist:  Norman Herrlich, MD  ? ?Referring MD: Rolm Gala, NP ? ?ASSESSMENT:   ? ?1. Sinus bradycardia   ?2. Nonrheumatic aortic valve stenosis   ? ?PLAN:   ? ?In order of problems listed above: ? ?She has asymptomatic sinus bradycardia it is rare these individuals need to have pacemaker therapy she needs to reinforce with physicians in the future any new prescriptions that her heart rate tends to be slow to avoid rate slowing with product such as treatment for glaucoma and hypertension and we will apply a 3-day monitor to assess her heart rate response to activity. ?She has more than expected on physical exam looks to have moderate aortic stenosis her last echocardiogram was limited the valve was not visualized and we will repeat the echocardiogram in my office to define if she has congenital heart disease bicuspid what I suggested in her age group or even potentially a subaortic membrane or hypertrophic cardiomyopathy ? ?Next appointment 1 year unless there is concern about the testing above ? ? ?Medication Adjustments/Labs and Tests Ordered: ?Current medicines are reviewed at length with the patient today.  Concerns regarding medicines are outlined above.  ?No orders of the defined types were placed in this encounter. ? ?No orders of the defined types were placed in this encounter. ?  ? ?Chief Complaint  ?Patient presents with  ? Bradycardia  ?I was unaware that I had aortic stenosis ? ?History of Present Illness:   ? ?Kaitlyn Maynard is a 66 y.o. female who is being seen today for the evaluation of bradycardia at the request of Rolm Gala, NP.  Had a recent EKG performed showing sinus bradycardia heart rate of 48 bpm.  I independently reviewed that EKG from 08/20/2021 showing sinus bradycardia 48 bpm otherwise normal. ? ?Recent laboratory studies cholesterol 189 LDL 96  triglycerides 54 HDL 83 ?Hemoglobin 12.9 platelets 214,000 potassium 4.4 sodium 138 GFR 75 cc creatinine 0.85 ? ?She had an echocardiogram performed at [facility] 07/08/2019 left ventricle normal in size wall thickness EF 55 to 60% with grade 1 diastolic dysfunction right ventricle is mildly enlarged with normal systolic function.  There is mild aortic stenosis seen with peak and mean gradients of 25 and 13 mmHg. ?Aortic valve was not well visualized indeterminate number of leaflets.  Aorta was normal in size. ? ?She was unaware that she has aortic stenosis.  She has no history of heart murmur or rheumatic heart disease no family history of valvular heart disease except for her mother in the late 83s with a heart murmur she has not needed intervention. ?She Tells me all through her life she was told that she has a slow heart rate had frequent EKGs when she saw PCP in [place] more than a decade ago but remains asymptomatic. ?A very vigorous active woman and has no exercise intolerance palpitation lightheadedness syncope.  No shortness of breath or chest pain. ?She was put on once weekly statin and her recent LDL was 89. ?She has not had mucosal bleeding. ?Past Medical History:  ?Diagnosis Date  ? GERD (gastroesophageal reflux disease)   ? ? ?Past Surgical History:  ?Procedure Laterality Date  ? CATARACT EXTRACTION Bilateral 10/2019  ? EYE SURGERY Left 2022  ? Yag procedure  ? EYE SURGERY Right 09/28/2021  ? Yag procedure  ? REPLACEMENT TOTAL KNEE  Right 2007  ? ? ?Current Medications: ?Current Meds  ?Medication Sig  ? atorvastatin (LIPITOR) 20 MG tablet Take 20 mg by mouth once a week.  ? Esomeprazole Magnesium 20 MG TBEC Take 20 mg by mouth daily.  ? Multiple Vitamins-Minerals (CENTRUM SILVER 50+WOMEN PO) Take 1 tablet by mouth daily.  ? prednisoLONE acetate (PRED FORTE) 1 % ophthalmic suspension Place 1 drop into the right eye 4 (four) times daily.  ?  ? ?Allergies:   Patient has no known allergies.   ? ?Social History  ? ?Socioeconomic History  ? Marital status: Widowed  ?  Spouse name: Not on file  ? Number of children: Not on file  ? Years of education: Not on file  ? Highest education level: Not on file  ?Occupational History  ? Not on file  ?Tobacco Use  ? Smoking status: Never  ? Smokeless tobacco: Never  ?Vaping Use  ? Vaping Use: Never used  ?Substance and Sexual Activity  ? Alcohol use: Never  ? Drug use: Never  ? Sexual activity: Not on file  ?Other Topics Concern  ? Not on file  ?Social History Narrative  ? Not on file  ? ?Social Determinants of Health  ? ?Financial Resource Strain: Not on file  ?Food Insecurity: Not on file  ?Transportation Needs: Not on file  ?Physical Activity: Not on file  ?Stress: Not on file  ?Social Connections: Not on file  ?  ? ?Family History: ?The patient's family history includes Diabetes in her father; Heart attack in her father; Heart disease in her father, maternal grandmother, and mother; Hypertension in her father, maternal grandmother, and mother; Stroke in her mother. ? ?ROS:   ?ROS Please see the history of present illness.    ? All other systems reviewed and are negative. ? ?EKGs/Labs/Other Studies Reviewed:   ? ?The following studies were reviewed today: ?See history ? ?Physical Exam:   ? ?VS:  BP (!) 168/92 (BP Location: Right Arm, Patient Position: Sitting, Cuff Size: Normal)   Pulse (!) 49   Ht 5' 4.25" (1.632 m)   Wt 225 lb (102.1 kg)   SpO2 97%   BMI 38.32 kg/m?    ? ?Wt Readings from Last 3 Encounters:  ?10/01/21 225 lb (102.1 kg)  ?08/20/21 227 lb (103 kg)  ?  ? ?GEN: Looks healthy appears arrange well nourished, well developed in no acute distress ?HEENT: Normal ?NECK: No JVD; No carotid bruits ?LYMPHATICS: No lymphadenopathy ?CARDIAC: Heart murmur is more than I expected is grade 2/6 to 3/6 and radiates up to the base of the right carotid as well as the right clavicular area.  This certainly is more than mild aortic stenosis we will get a repeat her  echocardiogram in my office S2 remains split and there is no aortic regurgitation RRR, rubs, gallops ?RESPIRATORY:  Clear to auscultation without rales, wheezing or rhonchi  ?ABDOMEN: Soft, non-tender, non-distended ?MUSCULOSKELETAL:  No edema; No deformity  ?SKIN: Warm and dry ?NEUROLOGIC:  Alert and oriented x 3 ?PSYCHIATRIC:  Normal affect  ? ? ? ?Signed, ?Norman Herrlich, MD  ?10/01/2021 9:18 AM    ?[facility] ?

## 2021-10-01 ENCOUNTER — Ambulatory Visit: Payer: Medicare Other | Admitting: Cardiology

## 2021-10-01 ENCOUNTER — Encounter: Payer: Self-pay | Admitting: Cardiology

## 2021-10-01 ENCOUNTER — Ambulatory Visit (INDEPENDENT_AMBULATORY_CARE_PROVIDER_SITE_OTHER): Payer: Medicare Other

## 2021-10-01 VITALS — BP 168/92 | HR 49 | Ht 64.25 in | Wt 225.0 lb

## 2021-10-01 DIAGNOSIS — R001 Bradycardia, unspecified: Secondary | ICD-10-CM

## 2021-10-01 DIAGNOSIS — I350 Nonrheumatic aortic (valve) stenosis: Secondary | ICD-10-CM

## 2021-10-01 NOTE — Patient Instructions (Addendum)
Medication Instructions:  ?Your physician recommends that you continue on your current medications as directed. Please refer to the Current Medication list given to you today. ? ?*If you need a refill on your cardiac medications before your next appointment, please call your pharmacy* ? ? ?Lab Work: ?None ?If you have labs (blood work) drawn today and your tests are completely normal, you will receive your results only by: ?MyChart Message (if you have MyChart) OR ?A paper copy in the mail ?If you have any lab test that is abnormal or we need to change your treatment, we will call you to review the results. ? ? ?Testing/Procedures: ?A zio monitor was ordered today. It will remain on for 3 days. You will then return monitor and event diary in provided box. It takes 1-2 weeks for report to be downloaded and returned to [place]. We will call you with the results. If monitor falls off or has orange flashing light, please call Zio for further instructions.  ? ?Your physician has requested that you have an echocardiogram. Echocardiography is a painless test that uses sound waves to create images of your heart. It provides your doctor with information about the size and shape of your heart and how well your heart?s chambers and valves are working. This procedure takes approximately one hour. There are no restrictions for this procedure. ? ? ? ?Follow-Up: ?At [facility], you and your health needs are our priority.  As part of our continuing mission to provide you with exceptional heart care, we have created designated Provider Care Teams.  These Care Teams include your primary Cardiologist (physician) and Advanced Practice Providers (APPs -  Physician Assistants and Nurse Practitioners) who all work together to provide you with the care you need, when you need it. ? ?We recommend signing up for the patient portal called "MyChart".  Sign up information is provided on this After Visit Summary.  MyChart is used to connect with  patients for Virtual Visits (Telemedicine).  Patients are able to view lab/test results, encounter notes, upcoming appointments, etc.  Non-urgent messages can be sent to your provider as well.   ?To learn more about what you can do with MyChart, go to [email or web address].   ? ?Your next appointment:   ?1 year(s) ? ?The format for your next appointment:   ?In Person ? ?Provider:   ?Norman Herrlich, MD  ? ? ?Other Instructions ?None ? ?Important Information About Sugar ? ? ? ? ? ? ? ? ? ?1. Avoid all over-the-counter antihistamines except Claritin/Loratadine and Zyrtec/Cetrizine. ?2. Avoid all combination including cold sinus allergies flu decongestant and sleep medications ?3. You can use Robitussin DM Mucinex and Mucinex DM for cough. ?4. can use Tylenol aspirin ibuprofen and naproxen but no combinations such as sleep or sinus.  ?

## 2021-10-07 DIAGNOSIS — H04123 Dry eye syndrome of bilateral lacrimal glands: Secondary | ICD-10-CM | POA: Diagnosis not present

## 2021-10-07 DIAGNOSIS — I350 Nonrheumatic aortic (valve) stenosis: Secondary | ICD-10-CM | POA: Diagnosis not present

## 2021-10-07 DIAGNOSIS — R001 Bradycardia, unspecified: Secondary | ICD-10-CM | POA: Diagnosis not present

## 2021-10-11 ENCOUNTER — Telehealth: Payer: Self-pay | Admitting: Cardiology

## 2021-10-11 NOTE — Telephone Encounter (Signed)
Results reviewed with pt as per Dr. Munley's note.  Pt verbalized understanding and had no additional questions. Routed to PCP  

## 2021-10-11 NOTE — Telephone Encounter (Signed)
Patient returning call for monitor results. 

## 2021-10-12 ENCOUNTER — Ambulatory Visit (INDEPENDENT_AMBULATORY_CARE_PROVIDER_SITE_OTHER): Payer: Medicare Other

## 2021-10-12 DIAGNOSIS — I350 Nonrheumatic aortic (valve) stenosis: Secondary | ICD-10-CM | POA: Diagnosis not present

## 2021-10-12 DIAGNOSIS — R001 Bradycardia, unspecified: Secondary | ICD-10-CM

## 2021-10-12 LAB — ECHOCARDIOGRAM COMPLETE
AR max vel: 1.1 cm2
AV Area VTI: 1.06 cm2
AV Area mean vel: 1.09 cm2
AV Mean grad: 32.5 mmHg
AV Peak grad: 53.1 mmHg
Ao pk vel: 3.65 m/s
Area-P 1/2: 3.53 cm2
S' Lateral: 2.3 cm

## 2021-10-14 ENCOUNTER — Encounter: Payer: Self-pay | Admitting: Cardiology

## 2021-11-25 DIAGNOSIS — S39011A Strain of muscle, fascia and tendon of abdomen, initial encounter: Secondary | ICD-10-CM | POA: Diagnosis not present

## 2021-11-25 DIAGNOSIS — R109 Unspecified abdominal pain: Secondary | ICD-10-CM | POA: Diagnosis not present

## 2022-02-23 ENCOUNTER — Other Ambulatory Visit: Payer: Self-pay | Admitting: Internal Medicine

## 2022-02-23 DIAGNOSIS — Z1231 Encounter for screening mammogram for malignant neoplasm of breast: Secondary | ICD-10-CM

## 2022-02-26 DIAGNOSIS — M25572 Pain in left ankle and joints of left foot: Secondary | ICD-10-CM | POA: Diagnosis not present

## 2022-03-11 ENCOUNTER — Ambulatory Visit
Admission: RE | Admit: 2022-03-11 | Discharge: 2022-03-11 | Disposition: A | Discharge status: Home / Self Care | Payer: Medicare Other | Source: Ambulatory Visit | Attending: Internal Medicine | Admitting: Internal Medicine

## 2022-03-11 DIAGNOSIS — Z1231 Encounter for screening mammogram for malignant neoplasm of breast: Secondary | ICD-10-CM

## 2022-05-17 DIAGNOSIS — R0981 Nasal congestion: Secondary | ICD-10-CM | POA: Diagnosis not present

## 2022-05-17 DIAGNOSIS — J019 Acute sinusitis, unspecified: Secondary | ICD-10-CM | POA: Diagnosis not present

## 2022-05-17 DIAGNOSIS — I160 Hypertensive urgency: Secondary | ICD-10-CM | POA: Diagnosis not present

## 2022-05-18 ENCOUNTER — Telehealth: Payer: Self-pay | Admitting: Cardiology

## 2022-05-18 NOTE — Telephone Encounter (Signed)
New Message:     Patient went to Urgent yesterday because of her Sinus. She said the doctor told her to call her Cardiologist. They checked her blood pressuree twice. It was 220 and 180 for the top number. She did not know ithe bottom readings,.    Pt c/o BP issue: STAT if pt c/o blurred vision, one-sided weakness or slurred speech  1. What are your last 5 BP readings? 220 and 180  2. Are you having any other symptoms (ex. Dizziness, headache, blurred vision, passed out)? no  3. What is your BP issue? High blood pressure-pt wants an appointment- first available is 06-14-22 with Dr Dulce Sellar

## 2022-05-18 NOTE — Telephone Encounter (Signed)
Pt states that she has been taking multiple medications over the counter for her respiratory sx. Pt states she was seen at Urgent Care yesterday and they advised her to stop all otc medications, gave her steroid injection and Augementin. Advised to keep a BP log and let us know in a few days how her BP is doing.

## 2022-07-19 DIAGNOSIS — R300 Dysuria: Secondary | ICD-10-CM | POA: Diagnosis not present

## 2022-07-19 DIAGNOSIS — R350 Frequency of micturition: Secondary | ICD-10-CM | POA: Diagnosis not present

## 2022-07-19 DIAGNOSIS — N3000 Acute cystitis without hematuria: Secondary | ICD-10-CM | POA: Diagnosis not present

## 2022-08-24 DIAGNOSIS — E7800 Pure hypercholesterolemia, unspecified: Secondary | ICD-10-CM | POA: Diagnosis not present

## 2022-08-24 DIAGNOSIS — Z833 Family history of diabetes mellitus: Secondary | ICD-10-CM | POA: Diagnosis not present

## 2022-08-24 DIAGNOSIS — Z79899 Other long term (current) drug therapy: Secondary | ICD-10-CM | POA: Diagnosis not present

## 2022-08-24 DIAGNOSIS — I10 Essential (primary) hypertension: Secondary | ICD-10-CM | POA: Diagnosis not present

## 2022-08-24 DIAGNOSIS — Z1211 Encounter for screening for malignant neoplasm of colon: Secondary | ICD-10-CM | POA: Diagnosis not present

## 2022-08-24 DIAGNOSIS — Z0000 Encounter for general adult medical examination without abnormal findings: Secondary | ICD-10-CM | POA: Diagnosis not present

## 2022-08-25 LAB — LAB REPORT - SCANNED: EGFR: 76

## 2022-09-05 DIAGNOSIS — Z1211 Encounter for screening for malignant neoplasm of colon: Secondary | ICD-10-CM | POA: Diagnosis not present

## 2022-09-05 DIAGNOSIS — Z1212 Encounter for screening for malignant neoplasm of rectum: Secondary | ICD-10-CM | POA: Diagnosis not present

## 2022-09-09 LAB — EXTERNAL GENERIC LAB PROCEDURE: COLOGUARD: NEGATIVE

## 2022-09-09 LAB — COLOGUARD: COLOGUARD: NEGATIVE

## 2022-09-21 DIAGNOSIS — E559 Vitamin D deficiency, unspecified: Secondary | ICD-10-CM | POA: Insufficient documentation

## 2022-10-03 NOTE — Progress Notes (Unsigned)
Cardiology Office Note:    Date:  10/04/2022   ID:  Kaitlyn Maynard, DOB [birth year], MRN [id number]  PCP:  Kaitlyn Kingdom, MD  Cardiologist:  Kaitlyn Herrlich, MD    Referring MD: Kaitlyn Gala, NP    ASSESSMENT:    1. Nonrheumatic aortic valve stenosis   2. Mixed hyperlipidemia    PLAN:    In order of problems listed above:  Moderate severity remains asymptomatic physical exam does not show significant change recheck echocardiogram intensify lipid-lowering therapy transition atorvastatin to bempedoic acid and also check a  lipid profile and LP(a) associated with rapid progression of aortic stenosis in 2 months Continue endocarditis prophylaxis   Next appointment: 1 year   Medication Adjustments/Labs and Tests Ordered: Current medicines are reviewed at length with the patient today.  Concerns regarding medicines are outlined above.  No orders of the defined types were placed in this encounter.  No orders of the defined types were placed in this encounter.  Chief complaint follow-up for aortic stenosis   History of Present Illness:    Kaitlyn Maynard is a 67 y.o. female with a hx of aortic stenosis sinus bradycardia last seen 10/01/2021.  Following her visit she had an echocardiogram performed 10/12/2021 moderate aortic stenosis was noted peak and mean gradients 53 and 33 mmHg VTI ratio 0.42 and aortic valve area 1.06 cm.  Left ventricular function remains normal EF 60 to 65%.  An event monitor reported 10/10/2021 showed sinus rhythm with an average heart rate of 66 bpm there were no episodes of sinus pauses or second or third-degree AV nodal block there were 2 brief episodes of SVT longest 15 complexes rate of 156 bpm asymptomatic.  Compliance with diet, lifestyle and medications: Yes  She was recently placed on atorvastatin and is taking it 3 days a week she has been poorly tolerant After discussion we will switch to bempedoic acid avoid muscle symptoms plan to  check lipids 2 months as well as LP(a) that can be associated with aortic stenosis She is retired exercising at [employer] no exercise intolerance edema shortness of breath chest pain palpitation or syncope She follows endocarditis prophylaxis already with a knee prosthesis Past Medical History:  Diagnosis Date   GERD (gastroesophageal reflux disease)     Past Surgical History:  Procedure Laterality Date   CATARACT EXTRACTION Bilateral 10/2019   EYE SURGERY Left 2022   Yag procedure   EYE SURGERY Right 09/28/2021   Yag procedure   REPLACEMENT TOTAL KNEE Right 2007    Current Medications: Current Meds  Medication Sig   Cholecalciferol (VITAMIN D3) 50 MCG (2000 UT) CAPS Take 2,000 Units by mouth daily.   Esomeprazole Magnesium 20 MG TBEC Take 20 mg by mouth daily.   Multiple Vitamins-Minerals (CENTRUM SILVER 50+WOMEN PO) Take 1 tablet by mouth daily.   [DISCONTINUED] atorvastatin (LIPITOR) 20 MG tablet Take 20 mg by mouth 2 (two) times a week.     Allergies:   Patient has no known allergies.   Social History   Socioeconomic History   Marital status: Widowed    Spouse name: Not on file   Number of children: Not on file   Years of education: Not on file   Highest education level: Not on file  Occupational History   Not on file  Tobacco Use   Smoking status: Never   Smokeless tobacco: Never  Vaping Use   Vaping Use: Never used  Substance and Sexual Activity   Alcohol use: Never  Drug use: Never   Sexual activity: Not on file  Other Topics Concern   Not on file  Social History Narrative   Not on file   Social Determinants of Health   Financial Resource Strain: Not on file  Food Insecurity: Not on file  Transportation Needs: Not on file  Physical Activity: Not on file  Stress: Not on file  Social Connections: Not on file     Family History: The patient's family history includes Diabetes in her father; Heart attack in her father; Heart disease in her father,  maternal grandmother, and mother; Hypertension in her father, maternal grandmother, and mother; Stroke in her mother. ROS:   Please see the history of present illness.    All other systems reviewed and are negative.  EKGs/Labs/Other Studies Reviewed:    The following studies were reviewed today:  Cardiac Studies & Procedures       ECHOCARDIOGRAM  ECHOCARDIOGRAM COMPLETE 10/12/2021  Narrative ECHOCARDIOGRAM REPORT    Patient Name:   Kaitlyn Maynard Grenz Date of Exam: 10/12/2021 Medical Rec #:  [id number]          Height:       64.3 in Accession #:    [id number]         Weight:       225.0 lb Date of Birth:  04/29/1956          BSA:          2.062 m Patient Age:    65 years           BP:           168/92 mmHg Patient Gender: F                  HR:           65 bpm. [facility]am [facility]  Procedure: 2D Echo, Cardiac Doppler and Color Doppler  Indications:    Sinus bradycardia [R00.1 (ICD-10-CM)]; Nonrheumatic aortic valve stenosis [I35.0 (ICD-10-CM)]  History:        Patient has no prior history of Echocardiogram examinations. Aortic Valve Disease; Arrythmias:Bradycardia.  Sonographer:    Margreta Journey RDCS Referring Phys: [id number] Baldo Daub   Sonographer Comments: Image acquisition challenging due to patient body habitus and Image acquisition challenging due to respiratory motion. IMPRESSIONS   1. Left ventricular ejection fraction, by estimation, is 60 to 65%. The left ventricle has normal function. The left ventricle has no regional wall motion abnormalities. Left ventricular diastolic parameters are consistent with Grade I diastolic dysfunction (impaired relaxation). 2. Right ventricular systolic function is normal. The right ventricular size is normal. There is normal pulmonary artery systolic pressure. 3. Left atrial size was moderately dilated. 4. The mitral valve is normal in structure. No evidence of mitral valve regurgitation. No evidence of mitral  stenosis. 5. The aortic valve was not well visualized. Aortic valve regurgitation is not visualized. Moderate aortic valve stenosis. 6. The inferior vena cava is normal in size with greater than 50% respiratory variability, suggesting right atrial pressure of 3 mmHg.  FINDINGS Left Ventricle: Left ventricular ejection fraction, by estimation, is 60 to 65%. The left ventricle has normal function. The left ventricle has no regional wall motion abnormalities. The left ventricular internal cavity size was normal in size. There is no left ventricular hypertrophy. Left ventricular diastolic parameters are consistent with Grade I diastolic dysfunction (impaired relaxation).  Right Ventricle: The right ventricular size is normal. No increase in right ventricular wall  thickness. Right ventricular systolic function is normal. There is normal pulmonary artery systolic pressure. The tricuspid regurgitant velocity is 2.86 m/s, and with an assumed right atrial pressure of 3 mmHg, the estimated right ventricular systolic pressure is 35.7 mmHg.  Left Atrium: Left atrial size was moderately dilated.  Right Atrium: Right atrial size was normal in size.  Pericardium: There is no evidence of pericardial effusion.  Mitral Valve: The mitral valve is normal in structure. No evidence of mitral valve regurgitation. No evidence of mitral valve stenosis.  Tricuspid Valve: The tricuspid valve is normal in structure. Tricuspid valve regurgitation is mild . No evidence of tricuspid stenosis.  Aortic Valve: The aortic valve was not well visualized. Aortic valve regurgitation is not visualized. Moderate aortic stenosis is present. Aortic valve mean gradient measures 32.5 mmHg. Aortic valve peak gradient measures 53.1 mmHg. Aortic valve area, by VTI measures 1.06 cm.  Pulmonic Valve: The pulmonic valve was normal in structure. Pulmonic valve regurgitation is not visualized. No evidence of pulmonic stenosis.  Aorta: The  aortic root is normal in size and structure.  Venous: The inferior vena cava is normal in size with greater than 50% respiratory variability, suggesting right atrial pressure of 3 mmHg.  IAS/Shunts: No atrial level shunt detected by color flow Doppler.   LEFT VENTRICLE PLAX 2D LVIDd:         4.00 cm   Diastology LVIDs:         2.30 cm   LV e' medial:    7.83 cm/s LV PW:         1.20 cm   LV E/e' medial:  17.0 LV IVS:        1.20 cm   LV e' lateral:   10.40 cm/s LVOT diam:     1.80 cm   LV E/e' lateral: 12.8 LV SV:         92 LV SV Index:   45 LVOT Area:     2.54 cm   RIGHT VENTRICLE RV Basal diam:  2.60 cm RV S prime:     14.50 cm/s TAPSE (M-mode): 2.4 cm  LEFT ATRIUM             Index        RIGHT ATRIUM           Index LA diam:        3.60 cm 1.75 cm/m   RA Area:     20.00 cm LA Vol (A2C):   45.0 ml 21.82 ml/m  RA Volume:   54.60 ml  26.48 ml/m LA Vol (A4C):   88.1 ml 42.72 ml/m LA Biplane Vol: 67.7 ml 32.83 ml/m AORTIC VALVE AV Area (Vmax):    1.10 cm AV Area (Vmean):   1.09 cm AV Area (VTI):     1.06 cm AV Vmax:           364.50 cm/s AV Vmean:          270.750 cm/s AV VTI:            0.868 m AV Peak Grad:      53.1 mmHg AV Mean Grad:      32.5 mmHg LVOT Vmax:         157.00 cm/s LVOT Vmean:        116.000 cm/s LVOT VTI:          0.363 m LVOT/AV VTI ratio: 0.42  AORTA Ao Root diam: 3.60 cm Ao Asc diam:  3.30 cm  MITRAL  VALVE                TRICUSPID VALVE MV Area (PHT): 3.53 cm     TR Peak grad:   32.7 mmHg MV Decel Time: 215 msec     TR Vmax:        286.00 cm/s MV E velocity: 133.00 cm/s MV A velocity: 111.00 cm/s  SHUNTS MV E/A ratio:  1.20         Systemic VTI:  0.36 m Systemic Diam: 1.80 cm  Belva Crome MD Electronically signed by Belva Crome MD Signature Date/Time: 10/12/2021/3:26:04 PM    Final    MONITORS  LONG TERM MONITOR (3-14 DAYS) 10/10/2021  Narrative Patch Wear Time:  2 days and 21 hours (2023-04-21T09:29:45-0400 to  2023-04-24T07:05:10-398)  Patient had a min HR of 38 bpm, max HR of 187 bpm, and avg HR of 66 bpm. Predominant underlying rhythm was Sinus Rhythm.  1 run of Ventricular Tachycardia occurred lasting 4 beats with a max rate of 187 bpm (avg 183 bpm).  Isolated VEs were rare (<1.0%), VE Couplets were rare (<1.0%), and no VE Triplets were present.   2 Supraventricular Tachycardia runs occurred, the run with the fastest interval lasting 15 beats with a max rate of 156 bpm (avg 135 bpm); the run with the fastest interval was also the longest. Isolated SVEs were rare (<1.0%), SVE Couplets were rare (<1.0%), and no SVE Triplets were present.  There were no diary or triggered events.           EKG:  EKG ordered today and personally reviewed.  The ekg ordered today demonstrates sinus rhythm minimal voltage criteria for LVH otherwise normal EKG  Recent Labs: 08/24/2022: Cholesterol 213 LDL 112 non-HDL cholesterol 130  Physical Exam:    VS:  BP 128/84 (BP Location: Right Arm, Patient Position: Sitting)   Pulse 61   Ht 5' 4.25" (1.632 m)   Wt 226 lb 12.8 oz (102.9 kg)   SpO2 96%   BMI 38.63 kg/m     Wt Readings from Last 3 Encounters:  10/04/22 226 lb 12.8 oz (102.9 kg)  10/01/21 225 lb (102.1 kg)  08/20/21 227 lb (103 kg)     GEN:  Well nourished, well developed in no acute distress HEENT: Normal NECK: No JVD; No carotid bruits LYMPHATICS: No lymphadenopathy CARDIAC: Grade 2/6 murmur aortic area harsh does not encompass S2 does not radiate to the carotids no aortic regurgitation RRR, RESPIRATORY:  Clear to auscultation without rales, wheezing or rhonchi  ABDOMEN: Soft, non-tender, non-distended MUSCULOSKELETAL:  No edema; No deformity  SKIN: Warm and dry NEUROLOGIC:  Alert and oriented x 3 PSYCHIATRIC:  Normal affect    Signed, Kaitlyn Herrlich, MD  10/04/2022 2:38 PM    [facility]

## 2022-10-04 ENCOUNTER — Encounter: Payer: Self-pay | Admitting: Cardiology

## 2022-10-04 ENCOUNTER — Ambulatory Visit: Payer: Medicare Other | Attending: Cardiology | Admitting: Cardiology

## 2022-10-04 VITALS — BP 128/84 | HR 61 | Ht 64.25 in | Wt 226.8 lb

## 2022-10-04 DIAGNOSIS — E782 Mixed hyperlipidemia: Secondary | ICD-10-CM

## 2022-10-04 DIAGNOSIS — I350 Nonrheumatic aortic (valve) stenosis: Secondary | ICD-10-CM | POA: Diagnosis not present

## 2022-10-04 MED ORDER — BEMPEDOIC ACID 180 MG PO TABS: 180.0000 mg | ORAL_TABLET | Freq: Every day | ORAL | 3 refills | Status: DC

## 2022-10-04 NOTE — Patient Instructions (Signed)
Medication Instructions:  Your physician has recommended you make the following change in your medication:   STOP: Atorvastatin START: Bempedoic Acid 180 mg daily (Take this medication 3 times per week for 2 weeks, then daily)  *If you need a refill on your cardiac medications before your next appointment, please call your pharmacy*   Lab Work: Your physician recommends that you return for lab work in:   Labs in 2 months:Lipids, LPa  If you have labs (blood work) drawn today and your tests are completely normal, you will receive your results only by: MyChart Message (if you have MyChart) OR A paper copy in the mail If you have any lab test that is abnormal or we need to change your treatment, we will call you to review the results.   Testing/Procedures: Your physician has requested that you have an echocardiogram. Echocardiography is a painless test that uses sound waves to create images of your heart. It provides your doctor with information about the size and shape of your heart and how well your heart's chambers and valves are working. This procedure takes approximately one hour. There are no restrictions for this procedure. Please do NOT wear cologne, perfume, aftershave, or lotions (deodorant is allowed). Please arrive 15 minutes prior to your appointment time.    Follow-Up: At [facility], you and your health needs are our priority.  As part of our continuing mission to provide you with exceptional heart care, we have created designated Provider Care Teams.  These Care Teams include your primary Cardiologist (physician) and Advanced Practice Providers (APPs -  Physician Assistants and Nurse Practitioners) who all work together to provide you with the care you need, when you need it.  We recommend signing up for the patient portal called "MyChart".  Sign up information is provided on this After Visit Summary.  MyChart is used to connect with patients for Virtual Visits  (Telemedicine).  Patients are able to view lab/test results, encounter notes, upcoming appointments, etc.  Non-urgent messages can be sent to your provider as well.   To learn more about what you can do with MyChart, go to [email or web address].    Your next appointment:   1 year(s)  Provider:   Norman Herrlich, MD    Other Instructions None

## 2022-10-11 ENCOUNTER — Telehealth: Payer: Self-pay

## 2022-10-11 NOTE — Telephone Encounter (Signed)
Prior authorization request receive from Cover My Meds for Nexletol. Request forward to Rx PA team Copy of request in chart media

## 2022-10-12 ENCOUNTER — Other Ambulatory Visit (HOSPITAL_COMMUNITY): Payer: Self-pay

## 2022-10-12 ENCOUNTER — Telehealth: Payer: Self-pay

## 2022-10-12 ENCOUNTER — Encounter: Payer: Self-pay | Admitting: Cardiology

## 2022-10-12 NOTE — Telephone Encounter (Signed)
Pharmacy Patient Advocate Encounter  Prior Authorization for NEXLETOL has been approved.    PA# [id number] Effective dates: 06/13/22 through 06/13/23  Haze Rushing, CPhT Pharmacy Patient Advocate Specialist Direct Number: [phone] Fax: [phone]

## 2022-10-12 NOTE — Telephone Encounter (Signed)
Pharmacy Patient Advocate Encounter   Received notification from [facility] MEDICARE that prior authorization for NEXLETOL is needed.    PA submitted on 10/12/22 Key [id number] Status is pending  Haze Rushing, CPhT Pharmacy Patient Advocate Specialist Direct Number: [phone] Fax: [phone]

## 2022-10-12 NOTE — Telephone Encounter (Signed)
Left patient a voicemail that prior authorization was approved and she should be able to pick medication up at her pharmacy.

## 2022-10-13 ENCOUNTER — Other Ambulatory Visit: Payer: Self-pay

## 2022-10-13 DIAGNOSIS — E782 Mixed hyperlipidemia: Secondary | ICD-10-CM

## 2022-10-13 MED ORDER — ATORVASTATIN CALCIUM 20 MG PO TABS: 20.0000 mg | ORAL_TABLET | Freq: Every day | ORAL | 3 refills | Status: DC

## 2022-10-18 ENCOUNTER — Ambulatory Visit: Payer: Medicare Other | Attending: Cardiology

## 2022-10-18 DIAGNOSIS — E782 Mixed hyperlipidemia: Secondary | ICD-10-CM

## 2022-10-18 DIAGNOSIS — I350 Nonrheumatic aortic (valve) stenosis: Secondary | ICD-10-CM | POA: Diagnosis not present

## 2022-10-18 LAB — ECHOCARDIOGRAM COMPLETE
AR max vel: 0.98 cm2
AV Area VTI: 0.94 cm2
AV Area mean vel: 0.95 cm2
AV Mean grad: 22.3 mmHg
AV Peak grad: 40.6 mmHg
Ao pk vel: 3.19 m/s
Area-P 1/2: 3.12 cm2
P 1/2 time: 557 msec
S' Lateral: 2.5 cm

## 2022-10-19 ENCOUNTER — Encounter: Payer: Self-pay | Admitting: Internal Medicine

## 2022-10-28 DIAGNOSIS — M85851 Other specified disorders of bone density and structure, right thigh: Secondary | ICD-10-CM | POA: Diagnosis not present

## 2022-10-28 DIAGNOSIS — M8580 Other specified disorders of bone density and structure, unspecified site: Secondary | ICD-10-CM | POA: Diagnosis not present

## 2022-10-29 DIAGNOSIS — N390 Urinary tract infection, site not specified: Secondary | ICD-10-CM | POA: Diagnosis not present

## 2022-10-29 DIAGNOSIS — N3000 Acute cystitis without hematuria: Secondary | ICD-10-CM | POA: Diagnosis not present

## 2022-10-29 DIAGNOSIS — R300 Dysuria: Secondary | ICD-10-CM | POA: Diagnosis not present

## 2022-11-06 DIAGNOSIS — R300 Dysuria: Secondary | ICD-10-CM | POA: Diagnosis not present

## 2022-11-06 DIAGNOSIS — R3915 Urgency of urination: Secondary | ICD-10-CM | POA: Diagnosis not present

## 2022-11-06 DIAGNOSIS — N3091 Cystitis, unspecified with hematuria: Secondary | ICD-10-CM | POA: Diagnosis not present

## 2022-11-28 DIAGNOSIS — R350 Frequency of micturition: Secondary | ICD-10-CM | POA: Diagnosis not present

## 2022-11-28 DIAGNOSIS — R7309 Other abnormal glucose: Secondary | ICD-10-CM | POA: Diagnosis not present

## 2022-11-28 DIAGNOSIS — E559 Vitamin D deficiency, unspecified: Secondary | ICD-10-CM | POA: Diagnosis not present

## 2022-11-28 DIAGNOSIS — N390 Urinary tract infection, site not specified: Secondary | ICD-10-CM | POA: Diagnosis not present

## 2022-11-28 DIAGNOSIS — R109 Unspecified abdominal pain: Secondary | ICD-10-CM | POA: Diagnosis not present

## 2022-12-01 DIAGNOSIS — K8020 Calculus of gallbladder without cholecystitis without obstruction: Secondary | ICD-10-CM | POA: Diagnosis not present

## 2022-12-01 DIAGNOSIS — K5730 Diverticulosis of large intestine without perforation or abscess without bleeding: Secondary | ICD-10-CM | POA: Diagnosis not present

## 2022-12-01 DIAGNOSIS — R109 Unspecified abdominal pain: Secondary | ICD-10-CM | POA: Diagnosis not present

## 2022-12-01 DIAGNOSIS — N390 Urinary tract infection, site not specified: Secondary | ICD-10-CM | POA: Diagnosis not present

## 2022-12-13 ENCOUNTER — Other Ambulatory Visit: Payer: Self-pay

## 2022-12-13 DIAGNOSIS — E782 Mixed hyperlipidemia: Secondary | ICD-10-CM | POA: Diagnosis not present

## 2022-12-14 LAB — HEPATIC FUNCTION PANEL
ALT: 20 IU/L (ref 0–32)
AST: 26 IU/L (ref 0–40)
Albumin: 4.2 g/dL (ref 3.9–4.9)
Alkaline Phosphatase: 74 IU/L (ref 44–121)
Bilirubin Total: 0.4 mg/dL (ref 0.0–1.2)
Bilirubin, Direct: 0.14 mg/dL (ref 0.00–0.40)
Total Protein: 6.1 g/dL (ref 6.0–8.5)

## 2022-12-14 LAB — LIPID PANEL
Chol/HDL Ratio: 2.3 ratio (ref 0.0–4.4)
Cholesterol, Total: 170 mg/dL (ref 100–199)
HDL: 74 mg/dL (ref >39)
LDL Chol Calc (NIH): 83 mg/dL (ref 0–99)
Triglycerides: 69 mg/dL (ref 0–149)
VLDL Cholesterol Cal: 13 mg/dL (ref 5–40)

## 2022-12-14 LAB — LIPOPROTEIN A (LPA): Lipoprotein (a): <8.4 nmol/L (ref <75.0)

## 2023-03-02 DIAGNOSIS — R7303 Prediabetes: Secondary | ICD-10-CM | POA: Diagnosis not present

## 2023-04-10 DIAGNOSIS — M25562 Pain in left knee: Secondary | ICD-10-CM | POA: Insufficient documentation

## 2023-04-10 DIAGNOSIS — M1712 Unilateral primary osteoarthritis, left knee: Secondary | ICD-10-CM | POA: Insufficient documentation

## 2023-04-18 ENCOUNTER — Other Ambulatory Visit: Payer: Self-pay | Admitting: Internal Medicine

## 2023-04-18 DIAGNOSIS — Z1231 Encounter for screening mammogram for malignant neoplasm of breast: Secondary | ICD-10-CM

## 2023-04-27 ENCOUNTER — Ambulatory Visit: Payer: Medicare Other

## 2023-05-18 ENCOUNTER — Ambulatory Visit
Admission: RE | Admit: 2023-05-18 | Discharge: 2023-05-18 | Disposition: A | Discharge status: Home / Self Care | Payer: Medicare Other | Source: Ambulatory Visit | Attending: Internal Medicine | Admitting: Internal Medicine

## 2023-05-18 DIAGNOSIS — Z1231 Encounter for screening mammogram for malignant neoplasm of breast: Secondary | ICD-10-CM

## 2023-06-12 ENCOUNTER — Telehealth: Payer: Self-pay | Admitting: Pharmacy Technician

## 2023-06-12 ENCOUNTER — Other Ambulatory Visit (HOSPITAL_COMMUNITY): Payer: Self-pay

## 2023-06-12 NOTE — Telephone Encounter (Signed)
Pharmacy Patient Advocate Encounter  Received notification from [facility] that Prior Authorization for nexletol has been APPROVED from 06/12/23 to 12/11/23   PA #/Case ID/Reference #: [id number]

## 2023-06-12 NOTE — Telephone Encounter (Signed)
Pharmacy Patient Advocate Encounter   Received notification from CoverMyMeds that prior authorization for nexletol is required/requested.   Insurance verification completed.   The patient is insured through [facility] .   Per test claim: PA required; PA submitted to above mentioned insurance via CoverMyMeds Key/confirmation #/EOC [facility] Status is pending

## 2023-06-27 DIAGNOSIS — M1712 Unilateral primary osteoarthritis, left knee: Secondary | ICD-10-CM | POA: Diagnosis not present

## 2023-08-09 DIAGNOSIS — M1712 Unilateral primary osteoarthritis, left knee: Secondary | ICD-10-CM | POA: Diagnosis not present

## 2023-09-07 DIAGNOSIS — M1712 Unilateral primary osteoarthritis, left knee: Secondary | ICD-10-CM | POA: Diagnosis not present

## 2023-09-11 DIAGNOSIS — Z961 Presence of intraocular lens: Secondary | ICD-10-CM | POA: Diagnosis not present

## 2023-10-03 ENCOUNTER — Other Ambulatory Visit: Payer: Self-pay | Admitting: Cardiology

## 2023-10-09 ENCOUNTER — Other Ambulatory Visit: Payer: Self-pay | Admitting: Cardiology

## 2023-10-09 NOTE — Telephone Encounter (Signed)
 Prescription sent to pharmacy.

## 2023-10-16 DIAGNOSIS — E7800 Pure hypercholesterolemia, unspecified: Secondary | ICD-10-CM | POA: Diagnosis not present

## 2023-10-16 DIAGNOSIS — M25562 Pain in left knee: Secondary | ICD-10-CM | POA: Diagnosis not present

## 2023-10-16 DIAGNOSIS — K219 Gastro-esophageal reflux disease without esophagitis: Secondary | ICD-10-CM | POA: Diagnosis not present

## 2023-10-16 DIAGNOSIS — Z0000 Encounter for general adult medical examination without abnormal findings: Secondary | ICD-10-CM | POA: Diagnosis not present

## 2023-10-23 DIAGNOSIS — R7303 Prediabetes: Secondary | ICD-10-CM | POA: Diagnosis not present

## 2023-10-23 DIAGNOSIS — M23322 Other meniscus derangements, posterior horn of medial meniscus, left knee: Secondary | ICD-10-CM | POA: Diagnosis not present

## 2023-10-23 DIAGNOSIS — Z79899 Other long term (current) drug therapy: Secondary | ICD-10-CM | POA: Diagnosis not present

## 2023-10-23 DIAGNOSIS — I10 Essential (primary) hypertension: Secondary | ICD-10-CM | POA: Diagnosis not present

## 2023-10-23 DIAGNOSIS — E559 Vitamin D deficiency, unspecified: Secondary | ICD-10-CM | POA: Diagnosis not present

## 2023-10-23 DIAGNOSIS — E7800 Pure hypercholesterolemia, unspecified: Secondary | ICD-10-CM | POA: Diagnosis not present

## 2023-10-23 DIAGNOSIS — M25562 Pain in left knee: Secondary | ICD-10-CM | POA: Diagnosis not present

## 2023-11-13 ENCOUNTER — Telehealth: Payer: Self-pay | Admitting: Pharmacy Technician

## 2023-11-13 NOTE — Telephone Encounter (Signed)
 Prior authorization was extended until 06/12/24 per insurance.

## 2023-11-14 DIAGNOSIS — G8929 Other chronic pain: Secondary | ICD-10-CM | POA: Diagnosis not present

## 2023-11-14 DIAGNOSIS — M25562 Pain in left knee: Secondary | ICD-10-CM | POA: Diagnosis not present

## 2023-12-04 ENCOUNTER — Ambulatory Visit: Admitting: Cardiology

## 2023-12-13 DIAGNOSIS — M1712 Unilateral primary osteoarthritis, left knee: Secondary | ICD-10-CM | POA: Diagnosis not present

## 2023-12-13 DIAGNOSIS — G8929 Other chronic pain: Secondary | ICD-10-CM | POA: Diagnosis not present

## 2023-12-13 DIAGNOSIS — M25562 Pain in left knee: Secondary | ICD-10-CM | POA: Diagnosis not present

## 2023-12-26 NOTE — Progress Notes (Signed)
 Sent message, via epic in basket, requesting orders in epic from Careers adviser.

## 2023-12-28 NOTE — Progress Notes (Addendum)
 COVID Vaccine Completed:  Date of COVID positive in last 90 days:  No  PCP - Aleck Milch, MD (office note requested) Cardiologist - Redell Leiter, MD  Chest x-ray - N/A EKG - 01-02-24 Epic Stress Test - 10+ years ECHO - 10-18-22 Epic Cardiac Cath - N/A Pacemaker/ICD device last checked: N/A Spinal Cord Stimulator: N/A Long Term Monitor - 10-08-21 Epic  Bowel Prep - N/A  Sleep Study - N/A CPAP -   Fasting Blood Sugar - N/A Checks Blood Sugar _____ times a day  Last dose of GLP1 agonist-  N/A GLP1 instructions:  Do not take after     Last dose of SGLT-2 inhibitors-  N/A SGLT-2 instructions:  Do not take after    Blood Thinner Instructions:  N/A Aspirin Instructions: Last Dose:  Activity level:  Can go up a flight of stairs and perform activities of daily living without stopping and without symptoms of chest pain or shortness of breath.  Anesthesia review: Mild to mod aortic stenosis, bradycardia.   BP elevated at PAT 180/76 and on recheck 175/67.  Patient denied headache,  chest pain or SOB.  States that she is under a lot of stress currently due to family issues.  Will recheck at home and notify PCP if BP remains elevated.  Patient denies shortness of breath, fever, cough and chest pain at PAT appointment  Patient verbalized understanding of instructions that were given to them at the PAT appointment. Patient was also instructed that they will need to review over the PAT instructions again at home before surgery.

## 2023-12-28 NOTE — Patient Instructions (Addendum)
 SURGICAL WAITING ROOM VISITATION Patients having surgery or a procedure may have no more than 2 support people in the waiting area - these visitors may rotate.    Children under the age of 103 must have an adult with them who is not the patient.  If the patient needs to stay at the hospital during part of their recovery, the visitor guidelines for inpatient rooms apply. Pre-op nurse will coordinate an appropriate time for 1 support person to accompany patient in pre-op.  This support person may not rotate.    Please refer to the [facility] website for the visitor guidelines for Inpatients (after your surgery is over and you are in a regular room).       Your procedure is scheduled on: 01-15-24   Report to [facility] Main Entrance    Report to admitting at 9:00 AM   Call this number if you have problems the morning of surgery [phone]   Do not eat food :After Midnight.   After Midnight you may have the following liquids until 8:30 AM DAY OF SURGERY  Water Non-Citrus Juices (without pulp, NO RED-Apple, White grape, White cranberry) Black Coffee (NO MILK/CREAM OR CREAMERS, sugar ok)  Clear Tea (NO MILK/CREAM OR CREAMERS, sugar ok) regular and decaf                             Plain Jell-O (NO RED)                                           Fruit ices (not with fruit pulp, NO RED)                                     Popsicles (NO RED)                                                               Sports drinks like Gatorade (NO RED)                   The day of surgery:  Drink ONE (1) Pre-Surgery Clear Ensure by 8:30 AM the morning of surgery. Drink in one sitting. Do not sip.  This drink was given to you during your hospital  pre-op appointment visit. Nothing else to drink after completing the Pre-Surgery Clear Ensure.          If you have questions, please contact your surgeon's office.   FOLLOW  ANY ADDITIONAL PRE OP INSTRUCTIONS YOU RECEIVED FROM YOUR SURGEON'S  OFFICE!!!     Oral Hygiene is also important to reduce your risk of infection.                                    Remember - BRUSH YOUR TEETH THE MORNING OF SURGERY WITH YOUR REGULAR TOOTHPASTE   Do NOT smoke after Midnight   Take these medicines the morning of surgery with A SIP OF WATER:    Atorvastatin    Esomeprazole  Solifenacin  Stop all vitamins and herbal supplements 7 days before surgery                              You may not have any metal on your body including hair pins, jewelry, and body piercing             Do not wear make-up, lotions, powders, perfumes or deodorant  Do not wear nail polish including gel and S&S, artificial/acrylic nails, or any other type of covering on natural nails including finger and toenails. If you have artificial nails, gel coating, etc. that needs to be removed by a nail salon please have this removed prior to surgery or surgery may need to be canceled/ delayed if the surgeon/ anesthesia feels like they are unable to be safely monitored.   Do not shave  48 hours prior to surgery.    Do not bring valuables to the hospital. [facility] IS NOT RESPONSIBLE   FOR VALUABLES.   Contacts, dentures or bridgework may not be worn into surgery.   Bring small overnight bag day of surgery.   DO NOT BRING YOUR HOME MEDICATIONS TO THE HOSPITAL. PHARMACY WILL DISPENSE MEDICATIONS LISTED ON YOUR MEDICATION LIST TO YOU DURING YOUR ADMISSION IN THE HOSPITAL!    Special Instructions: Bring a copy of your healthcare power of attorney and living will documents the day of surgery if you haven't scanned them before.              Please read over the following fact sheets you were given: IF YOU HAVE QUESTIONS ABOUT YOUR PRE-OP INSTRUCTIONS PLEASE CALL [phone] [name]  If you received a COVID test during your pre-op visit  it is requested that you wear a mask when out in public, stay away from anyone that may not be feeling well and notify your surgeon if you  develop symptoms. If you test positive for Covid or have been in contact with anyone that has tested positive in the last 10 days please notify you surgeon.    Pre-operative 5 CHG Bath Instructions   You can play a key role in reducing the risk of infection after surgery. Your skin needs to be as free of germs as possible. You can reduce the number of germs on your skin by washing with CHG (chlorhexidine gluconate) soap before surgery. CHG is an antiseptic soap that kills germs and continues to kill germs even after washing.   DO NOT use if you have an allergy to chlorhexidine/CHG or antibacterial soaps. If your skin becomes reddened or irritated, stop using the CHG and notify one of our RNs at [phone].   Please shower with the CHG soap starting 4 days before surgery using the following schedule:     Please keep in mind the following:  DO NOT shave, including legs and underarms, starting the day of your first shower.   You may shave your face at any point before/day of surgery.  Place clean sheets on your bed the day you start using CHG soap. Use a clean washcloth (not used since being washed) for each shower. DO NOT sleep with pets once you start using the CHG.   CHG Shower Instructions:  If you choose to wash your hair and private area, wash first with your normal shampoo/soap.  After you use shampoo/soap, rinse your hair and body thoroughly to remove shampoo/soap residue.  Turn the water OFF and apply about  3 tablespoons (45 ml) of CHG soap to a CLEAN washcloth.  Apply CHG soap ONLY FROM YOUR NECK DOWN TO YOUR TOES (washing for 3-5 minutes)  DO NOT use CHG soap on face, private areas, open wounds, or sores.  Pay special attention to the area where your surgery is being performed.  If you are having back surgery, having someone wash your back for you may be helpful. Wait 2 minutes after CHG soap is applied, then you may rinse off the CHG soap.  Pat dry with a clean towel  Put on  clean clothes/pajamas   If you choose to wear lotion, please use ONLY the CHG-compatible lotions on the back of this paper.     Additional instructions for the day of surgery: DO NOT APPLY any lotions, deodorants, cologne, or perfumes.   Put on clean/comfortable clothes.  Brush your teeth.  Ask your nurse before applying any prescription medications to the skin.      CHG Compatible Lotions   Aveeno Moisturizing lotion  Cetaphil Moisturizing Cream  Cetaphil Moisturizing Lotion  Clairol Herbal Essence Moisturizing Lotion, Dry Skin  Clairol Herbal Essence Moisturizing Lotion, Extra Dry Skin  Clairol Herbal Essence Moisturizing Lotion, Normal Skin  Curel Age Defying Therapeutic Moisturizing Lotion with Alpha Hydroxy  Curel Extreme Care Body Lotion  Curel Soothing Hands Moisturizing Hand Lotion  Curel Therapeutic Moisturizing Cream, Fragrance-Free  Curel Therapeutic Moisturizing Lotion, Fragrance-Free  Curel Therapeutic Moisturizing Lotion, Original Formula  Eucerin Daily Replenishing Lotion  Eucerin Dry Skin Therapy Plus Alpha Hydroxy Crme  Eucerin Dry Skin Therapy Plus Alpha Hydroxy Lotion  Eucerin Original Crme  Eucerin Original Lotion  Eucerin Plus Crme Eucerin Plus Lotion  Eucerin TriLipid Replenishing Lotion  Keri Anti-Bacterial Hand Lotion  Keri Deep Conditioning Original Lotion Dry Skin Formula Softly Scented  Keri Deep Conditioning Original Lotion, Fragrance Free Sensitive Skin Formula  Keri Lotion Fast Absorbing Fragrance Free Sensitive Skin Formula  Keri Lotion Fast Absorbing Softly Scented Dry Skin Formula  Keri Original Lotion  Keri Skin Renewal Lotion Keri Silky Smooth Lotion  Keri Silky Smooth Sensitive Skin Lotion  Nivea Body Creamy Conditioning Oil  Nivea Body Extra Enriched Lotion  Nivea Body Original Lotion  Nivea Body Sheer Moisturizing Lotion Nivea Crme  Nivea Skin Firming Lotion  NutraDerm 30 Skin Lotion  NutraDerm Skin Lotion  NutraDerm  Therapeutic Skin Cream  NutraDerm Therapeutic Skin Lotion  ProShield Protective Hand Cream  Provon moisturizing lotion   PATIENT SIGNATURE_________________________________  NURSE SIGNATURE__________________________________  ________________________________________________________________________    Kaitlyn Maynard  An incentive spirometer is a tool that can help keep your lungs clear and active. This tool measures how well you are filling your lungs with each breath. Taking long deep breaths may help reverse or decrease the chance of developing breathing (pulmonary) problems (especially infection) following: A long period of time when you are unable to move or be active. BEFORE THE PROCEDURE  If the spirometer includes an indicator to show your best effort, your nurse or respiratory therapist will set it to a desired goal. If possible, sit up straight or lean slightly forward. Try not to slouch. Hold the incentive spirometer in an upright position. INSTRUCTIONS FOR USE  Sit on the edge of your bed if possible, or sit up as far as you can in bed or on a chair. Hold the incentive spirometer in an upright position. Breathe out normally. Place the mouthpiece in your mouth and seal your lips tightly around it. Breathe in slowly and as deeply  as possible, raising the piston or the ball toward the top of the column. Hold your breath for 3-5 seconds or for as long as possible. Allow the piston or ball to fall to the bottom of the column. Remove the mouthpiece from your mouth and breathe out normally. Rest for a few seconds and repeat Steps 1 through 7 at least 10 times every 1-2 hours when you are awake. Take your time and take a few normal breaths between deep breaths. The spirometer may include an indicator to show your best effort. Use the indicator as a goal to work toward during each repetition. After each set of 10 deep breaths, practice coughing to be sure your lungs are clear. If  you have an incision (the cut made at the time of surgery), support your incision when coughing by placing a pillow or rolled up towels firmly against it. Once you are able to get out of bed, walk around indoors and cough well. You may stop using the incentive spirometer when instructed by your caregiver.  RISKS AND COMPLICATIONS Take your time so you do not get dizzy or light-headed. If you are in pain, you may need to take or ask for pain medication before doing incentive spirometry. It is harder to take a deep breath if you are having pain. AFTER USE Rest and breathe slowly and easily. It can be helpful to keep track of a log of your progress. Your caregiver can provide you with a simple table to help with this. If you are using the spirometer at home, follow these instructions: SEEK MEDICAL CARE IF:  You are having difficultly using the spirometer. You have trouble using the spirometer as often as instructed. Your pain medication is not giving enough relief while using the spirometer. You develop fever of 100.5 F (38.1 C) or higher. SEEK IMMEDIATE MEDICAL CARE IF:  You cough up bloody sputum that had not been present before. You develop fever of 102 F (38.9 C) or greater. You develop worsening pain at or near the incision site. MAKE SURE YOU:  Understand these instructions. Will watch your condition. Will get help right away if you are not doing well or get worse. Document Released: 10/10/2006 Document Revised: 08/22/2011 Document Reviewed: 12/11/2006 [facility] Patient Information 2014 [place], [place].   ________________________________________________________________________

## 2023-12-29 ENCOUNTER — Encounter (HOSPITAL_COMMUNITY): Payer: Self-pay

## 2024-01-02 ENCOUNTER — Encounter (HOSPITAL_COMMUNITY)
Admission: RE | Admit: 2024-01-02 | Discharge: 2024-01-02 | Disposition: A | Discharge status: Home / Self Care | Source: Ambulatory Visit | Attending: Orthopedic Surgery | Admitting: Orthopedic Surgery

## 2024-01-02 ENCOUNTER — Other Ambulatory Visit: Payer: Self-pay

## 2024-01-02 ENCOUNTER — Encounter (HOSPITAL_COMMUNITY): Payer: Self-pay

## 2024-01-02 VITALS — BP 175/67 | HR 55 | Temp 98.4°F | Resp 16 | Ht 64.0 in | Wt 210.0 lb

## 2024-01-02 DIAGNOSIS — M1712 Unilateral primary osteoarthritis, left knee: Secondary | ICD-10-CM | POA: Insufficient documentation

## 2024-01-02 DIAGNOSIS — Z01818 Encounter for other preprocedural examination: Secondary | ICD-10-CM | POA: Insufficient documentation

## 2024-01-02 DIAGNOSIS — I2510 Atherosclerotic heart disease of native coronary artery without angina pectoris: Secondary | ICD-10-CM | POA: Diagnosis not present

## 2024-01-02 HISTORY — DX: Cardiac murmur, unspecified: R01.1

## 2024-01-02 HISTORY — DX: Nonrheumatic aortic (valve) stenosis: I35.0

## 2024-01-02 HISTORY — DX: Bradycardia, unspecified: R00.1

## 2024-01-02 HISTORY — DX: Unspecified osteoarthritis, unspecified site: M19.90

## 2024-01-02 LAB — CBC
HCT: 39.8 % (ref 36.0–46.0)
Hemoglobin: 12.7 g/dL (ref 12.0–15.0)
MCH: 31.3 pg (ref 26.0–34.0)
MCHC: 31.9 g/dL (ref 30.0–36.0)
MCV: 98 fL (ref 80.0–100.0)
Platelets: 225 K/uL (ref 150–400)
RBC: 4.06 MIL/uL (ref 3.87–5.11)
RDW: 12.3 % (ref 11.5–15.5)
WBC: 5.4 K/uL (ref 4.0–10.5)
nRBC: 0 % (ref 0.0–0.2)

## 2024-01-02 LAB — BASIC METABOLIC PANEL WITH GFR
Anion gap: 9 (ref 5–15)
BUN: 11 mg/dL (ref 8–23)
CO2: 24 mmol/L (ref 22–32)
Calcium: 9.8 mg/dL (ref 8.9–10.3)
Chloride: 99 mmol/L (ref 98–111)
Creatinine, Ser: 0.79 mg/dL (ref 0.44–1.00)
GFR, Estimated: >60 mL/min (ref >60)
Glucose, Bld: 108 mg/dL — ABNORMAL HIGH (ref 70–99)
Potassium: 4.7 mmol/L (ref 3.5–5.1)
Sodium: 132 mmol/L — ABNORMAL LOW (ref 135–145)

## 2024-01-02 LAB — SURGICAL PCR SCREEN
MRSA, PCR: NEGATIVE
Staphylococcus aureus: POSITIVE — AB

## 2024-01-02 NOTE — Progress Notes (Signed)
 PCR results sent to Dr. Ronnie Derby to review.

## 2024-01-03 ENCOUNTER — Encounter (HOSPITAL_COMMUNITY): Payer: Self-pay

## 2024-01-03 NOTE — Anesthesia Preprocedure Evaluation (Addendum)
 Anesthesia Evaluation  Patient identified by MRN, date of birth, ID band Patient awake    Reviewed: Allergy & Precautions, H&P , NPO status , Patient's Chart, lab work & pertinent test results  Airway Mallampati: II  TM Distance: >3 FB Neck ROM: Full    Dental no notable dental hx.    Pulmonary neg pulmonary ROS   Pulmonary exam normal breath sounds clear to auscultation       Cardiovascular Exercise Tolerance: Good negative cardio ROS Normal cardiovascular exam+ Valvular Problems/Murmurs  Rhythm:Regular Rate:Normal     Neuro/Psych negative neurological ROS  negative psych ROS   GI/Hepatic negative GI ROS, Neg liver ROS,GERD  ,,  Endo/Other  negative endocrine ROS    Renal/GU negative Renal ROS  negative genitourinary   Musculoskeletal negative musculoskeletal ROS (+) Arthritis ,    Abdominal   Peds negative pediatric ROS (+)  Hematology negative hematology ROS (+)   Anesthesia Other Findings   Reproductive/Obstetrics negative OB ROS                              Anesthesia Physical Anesthesia Plan  ASA: 3  Anesthesia Plan: MAC, Regional and Spinal   Post-op Pain Management: Regional block*, Tylenol  PO (pre-op)*, Celebrex  PO (pre-op)* and Gabapentin  PO (pre-op)*   Induction: Intravenous  PONV Risk Score and Plan: 2 and Ondansetron , Dexamethasone , Propofol  infusion and Treatment may vary due to age or medical condition  Airway Management Planned: Natural Airway and Simple Face Mask  Additional Equipment: None  Intra-op Plan:   Post-operative Plan:   Informed Consent: I have reviewed the patients History and Physical, chart, labs and discussed the procedure including the risks, benefits and alternatives for the proposed anesthesia with the patient or authorized representative who has indicated his/her understanding and acceptance.       Plan Discussed with:  Anesthesiologist and CRNA  Anesthesia Plan Comments: (See PAT note from 7/22  DISCUSSION: Kaitlyn Maynard is a 67 yo female with PMH of mild-mod aortic stenosis, GERD, hiatal hernia, arthritis, hx of R TKA in 2007   Pt follows with Cardiology for AS. Last seen on 10/04/22 by Dr. Monetta. Echo was ordered which was done on 10/18/22 and showed normal LVEF 55-60%, grade II DD, mild-mod AS with mean gradient of 22 mmHg.    Patient seen by PCP on 10/16/23 for routine f/u (office note scanned in media on 7/23). Pt having knee pain but all other issues stable. BP 130/80 at that visit.   At PAT visit pt denies CP/SOB and can do a flight of stairs without symptoms. BP noted to be elevated. Pt endorsed stress. She is not on any antihypertensives. She was advised to monitor at home and notify PCP if it remains elevated. Echo 10/18/22:   IMPRESSIONS      1. Sigmoid septum noted. Left ventricular ejection fraction, by estimation, is 55 to 60%. The left ventricle has normal function. The left ventricle has no regional wall motion abnormalities. Left ventricular diastolic parameters are consistent with Grade II diastolic dysfunction (pseudonormalization). The average left ventricular global longitudinal strain is -19.3 %. The global longitudinal strain is normal.  2. Right ventricular systolic function is normal. The right ventricular size is normal. There is normal pulmonary artery systolic pressure.  3. Left atrial size was moderately dilated.  4. The mitral valve is normal in structure. No evidence of mitral valve regurgitation. No evidence of mitral stenosis.  5. DI 0.37,  SVi 39. The aortic valve is calcified. There is moderate calcification of the aortic valve. There is mild thickening of the aortic valve. Aortic valve regurgitation is mild. Mild to moderate aortic valve stenosis. Aortic valve mean gradient measures 22.2 mmHg.  6. The inferior vena cava is normal in size with greater than  50% respiratory variability, suggesting right atrial pressure of 3 mmHg.     )         Anesthesia Quick Evaluation

## 2024-01-03 NOTE — Progress Notes (Signed)
 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 Last seen on 10/04/22 by Dr. Monetta. Echo was ordered which was done on 10/18/22 and showed normal LVEF 55-60%, grade II DD, mild-mod AS with mean gradient of 22 mmHg.   Patient seen by PCP on 10/16/23 for routine f/u (office note scanned in media on 7/23). Pt having knee pain but all other issues stable. BP 130/80 at that visit.  At PAT visit pt denies CP/SOB and can do a flight of stairs without symptoms. BP noted to be elevated. Pt endorsed stress. She is not on any antihypertensives. She was advised to monitor at home and notify PCP if it remains elevated.   VS: BP (!) 175/67   Pulse (!) 55   Temp 36.9 C (Oral)   Resp 16   Ht 5' 4 (1.626 m)   Wt 95.3 kg   SpO2 100%   BMI 36.05 kg/m   PROVIDERS: Jefferey Fitch, MD   LABS: Labs reviewed: Acceptable for surgery. (all labs ordered are listed, but only abnormal results are displayed)  Labs Reviewed  SURGICAL PCR SCREEN - Abnormal; Notable for the following components:      Result Value   Staphylococcus aureus POSITIVE (*)    All other components within normal limits  BASIC METABOLIC PANEL WITH GFR - Abnormal; Notable for the following components:   Sodium 132 (*)    Glucose, Bld 108 (*)    All other components within normal limits  CBC     IMAGES:   EKG 01/02/24:  Sinus bradycardia, rate 52 Minimal voltage criteria for LVH, may be normal variant ( R in aVL ) Cannot rule out Anterior infarct , age undetermined  CV:  Echo  10/18/22:  IMPRESSIONS    1. Sigmoid septum noted. Left ventricular ejection fraction, by estimation, is 55 to 60%. The left ventricle has normal function. The left ventricle has no regional wall motion abnormalities. Left ventricular diastolic parameters are consistent with Grade II diastolic dysfunction (pseudonormalization). The average left ventricular global longitudinal strain is -19.3 %. The global longitudinal strain is normal.  2. Right ventricular systolic function is normal. The right ventricular size is normal. There is normal pulmonary artery systolic pressure.  3. Left atrial size was moderately dilated.  4. The mitral valve is normal in structure. No evidence of mitral valve regurgitation. No evidence of mitral stenosis.  5. DI 0.37, SVi 39. The aortic valve is calcified. There is moderate calcification of the aortic valve. There is mild thickening of the aortic valve. Aortic valve regurgitation is mild. Mild to moderate aortic valve stenosis. Aortic valve mean gradient measures 22.2 mmHg.  6. The inferior vena cava is normal in size with greater than 50% respiratory variability, suggesting right atrial pressure of 3 mmHg. Past Medical History:  Diagnosis Date   Aortic stenosis    Arthritis    Bradycardia    GERD (gastroesophageal reflux disease)    Heart murmur     Past Surgical History:  Procedure Laterality Date   CATARACT  EXTRACTION Bilateral 10/2019   EYE SURGERY Left 2022   Yag procedure   EYE SURGERY Right 09/28/2021   Yag procedure   REPLACEMENT TOTAL KNEE Right 2007    MEDICATIONS:  ascorbic acid (VITAMIN C) 500 MG tablet   atorvastatin  (LIPITOR) 20 MG tablet   esomeprazole (NEXIUM) 20 MG capsule   Menthol, Topical Analgesic, (BIOFREEZE COLORLESS EX)   Menthol, Topical Analgesic, (ICY HOT EX)   Multiple Vitamin (MULTIVITAMIN WITH MINERALS) TABS tablet   solifenacin (VESICARE) 5 MG tablet   Vitamin D, Cholecalciferol, 25 MCG (1000 UT) TABS    No current facility-administered medications for this encounter.   Kaitlyn Maynard [facility] Surgical Short Stay/Anesthesiology [facility] Phone [phone] 01/03/2024 3:17 PM

## 2024-01-04 ENCOUNTER — Other Ambulatory Visit: Payer: Self-pay | Admitting: Cardiology

## 2024-01-09 ENCOUNTER — Other Ambulatory Visit: Payer: Self-pay | Admitting: Orthopedic Surgery

## 2024-01-09 DIAGNOSIS — G8929 Other chronic pain: Secondary | ICD-10-CM

## 2024-01-15 ENCOUNTER — Encounter (HOSPITAL_COMMUNITY): Payer: Self-pay | Admitting: Orthopedic Surgery

## 2024-01-15 ENCOUNTER — Ambulatory Visit (HOSPITAL_COMMUNITY): Payer: Self-pay | Admitting: Medical

## 2024-01-15 ENCOUNTER — Ambulatory Visit (HOSPITAL_COMMUNITY)
Admission: RE | Admit: 2024-01-15 | Discharge: 2024-01-15 | Disposition: A | Discharge status: Home / Self Care | Source: Ambulatory Visit | Attending: Orthopedic Surgery | Admitting: Orthopedic Surgery

## 2024-01-15 ENCOUNTER — Ambulatory Visit (HOSPITAL_BASED_OUTPATIENT_CLINIC_OR_DEPARTMENT_OTHER): Admitting: Anesthesiology

## 2024-01-15 ENCOUNTER — Other Ambulatory Visit: Payer: Self-pay

## 2024-01-15 ENCOUNTER — Encounter (HOSPITAL_COMMUNITY)
Admission: RE | Disposition: A | Discharge status: Home / Self Care | Payer: Self-pay | Source: Ambulatory Visit | Attending: Orthopedic Surgery

## 2024-01-15 DIAGNOSIS — K219 Gastro-esophageal reflux disease without esophagitis: Secondary | ICD-10-CM | POA: Diagnosis not present

## 2024-01-15 DIAGNOSIS — M1712 Unilateral primary osteoarthritis, left knee: Secondary | ICD-10-CM | POA: Diagnosis not present

## 2024-01-15 DIAGNOSIS — I350 Nonrheumatic aortic (valve) stenosis: Secondary | ICD-10-CM

## 2024-01-15 DIAGNOSIS — G8929 Other chronic pain: Secondary | ICD-10-CM

## 2024-01-15 DIAGNOSIS — G8918 Other acute postprocedural pain: Secondary | ICD-10-CM | POA: Diagnosis not present

## 2024-01-15 HISTORY — PX: PARTIAL KNEE ARTHROPLASTY: SHX2174

## 2024-01-15 LAB — CBC WITH DIFFERENTIAL/PLATELET
Abs Immature Granulocytes: 0.01 K/uL (ref 0.00–0.07)
Basophils Absolute: 0 K/uL (ref 0.0–0.1)
Basophils Relative: 0 %
Eosinophils Absolute: 0.2 K/uL (ref 0.0–0.5)
Eosinophils Relative: 3 %
HCT: 37.2 % (ref 36.0–46.0)
Hemoglobin: 12.1 g/dL (ref 12.0–15.0)
Immature Granulocytes: 0 %
Lymphocytes Relative: 19 %
Lymphs Abs: 0.9 K/uL (ref 0.7–4.0)
MCH: 31.9 pg (ref 26.0–34.0)
MCHC: 32.5 g/dL (ref 30.0–36.0)
MCV: 98.2 fL (ref 80.0–100.0)
Monocytes Absolute: 0.2 K/uL (ref 0.1–1.0)
Monocytes Relative: 5 %
Neutro Abs: 3.3 K/uL (ref 1.7–7.7)
Neutrophils Relative %: 73 %
Platelets: 198 K/uL (ref 150–400)
RBC: 3.79 MIL/uL — ABNORMAL LOW (ref 3.87–5.11)
RDW: 12.5 % (ref 11.5–15.5)
WBC: 4.6 K/uL (ref 4.0–10.5)
nRBC: 0 % (ref 0.0–0.2)

## 2024-01-15 LAB — COMPREHENSIVE METABOLIC PANEL WITH GFR
ALT: 16 U/L (ref 0–44)
AST: 22 U/L (ref 15–41)
Albumin: 3.5 g/dL (ref 3.5–5.0)
Alkaline Phosphatase: 45 U/L (ref 38–126)
Anion gap: 10 (ref 5–15)
BUN: 12 mg/dL (ref 8–23)
CO2: 23 mmol/L (ref 22–32)
Calcium: 9.4 mg/dL (ref 8.9–10.3)
Chloride: 101 mmol/L (ref 98–111)
Creatinine, Ser: 0.85 mg/dL (ref 0.44–1.00)
GFR, Estimated: >60 mL/min (ref >60)
Glucose, Bld: 122 mg/dL — ABNORMAL HIGH (ref 70–99)
Potassium: 3.9 mmol/L (ref 3.5–5.1)
Sodium: 134 mmol/L — ABNORMAL LOW (ref 135–145)
Total Bilirubin: 0.7 mg/dL (ref 0.0–1.2)
Total Protein: 6 g/dL — ABNORMAL LOW (ref 6.5–8.1)

## 2024-01-15 SURGERY — ARTHROPLASTY, KNEE, UNICOMPARTMENTAL
Anesthesia: Monitor Anesthesia Care | Site: Knee | Laterality: Left

## 2024-01-15 MED ORDER — POVIDONE-IODINE 10 % EX SWAB: 2.0000 | Freq: Once | CUTANEOUS | Status: DC

## 2024-01-15 MED ORDER — SODIUM CHLORIDE 0.9 % IV SOLN
INTRAVENOUS | Status: DC | PRN
  Administered 2024-01-15: 70 mL

## 2024-01-15 MED ORDER — MIDAZOLAM HCL 2 MG/2ML IJ SOLN
1.0000 mg | INTRAMUSCULAR | Status: DC
  Administered 2024-01-15 (×2): 1 mg via INTRAVENOUS

## 2024-01-15 MED ORDER — KETOROLAC TROMETHAMINE 30 MG/ML IJ SOLN
INTRAMUSCULAR | Status: AC
  Filled 2024-01-15: qty 1

## 2024-01-15 MED ORDER — BUPIVACAINE-EPINEPHRINE (PF) 0.5% -1:200000 IJ SOLN
INTRAMUSCULAR | Status: DC | PRN
  Administered 2024-01-15: 20 mL via PERINEURAL

## 2024-01-15 MED ORDER — METHOCARBAMOL 500 MG PO TABS: 500.0000 mg | ORAL_TABLET | Freq: Four times a day (QID) | ORAL | 0 refills | Status: DC

## 2024-01-15 MED ORDER — LACTATED RINGERS IV BOLUS
500.0000 mL | Freq: Once | INTRAVENOUS | Status: AC
  Administered 2024-01-15: 500 mL via INTRAVENOUS

## 2024-01-15 MED ORDER — BUPIVACAINE-EPINEPHRINE (PF) 0.25% -1:200000 IJ SOLN
INTRAMUSCULAR | Status: AC
  Filled 2024-01-15: qty 30

## 2024-01-15 MED ORDER — PHENYLEPHRINE HCL-NACL 20-0.9 MG/250ML-% IV SOLN
INTRAVENOUS | Status: DC | PRN
Start: 2024-01-15 — End: 2024-01-15
  Administered 2024-01-15: 30 ug/min via INTRAVENOUS

## 2024-01-15 MED ORDER — KETOROLAC TROMETHAMINE 30 MG/ML IJ SOLN
INTRAMUSCULAR | Status: DC | PRN
Start: 2024-01-15 — End: 2024-01-15
  Administered 2024-01-15: 30 mg via INTRAVENOUS

## 2024-01-15 MED ORDER — CHLORHEXIDINE GLUCONATE 0.12 % MT SOLN
15.0000 mL | Freq: Once | OROMUCOSAL | Status: AC
  Administered 2024-01-15: 15 mL via OROMUCOSAL

## 2024-01-15 MED ORDER — BUPIVACAINE IN DEXTROSE 0.75-8.25 % IT SOLN
INTRATHECAL | Status: DC | PRN
  Administered 2024-01-15: 1.8 mL via INTRATHECAL

## 2024-01-15 MED ORDER — FENTANYL CITRATE (PF) 100 MCG/2ML IJ SOLN: INTRAMUSCULAR | Status: DC | PRN

## 2024-01-15 MED ORDER — BUPIVACAINE LIPOSOME 1.3 % IJ SUSP: 20.0000 mL | Freq: Once | INTRAMUSCULAR | Status: DC

## 2024-01-15 MED ORDER — METHOCARBAMOL 500 MG PO TABS
ORAL_TABLET | ORAL | Status: AC
  Filled 2024-01-15: qty 1

## 2024-01-15 MED ORDER — OXYCODONE HCL 5 MG PO TABS: 5.0000 mg | ORAL_TABLET | ORAL | 0 refills | Status: DC | PRN

## 2024-01-15 MED ORDER — METHOCARBAMOL 1000 MG/10ML IJ SOLN: 500.0000 mg | Freq: Four times a day (QID) | INTRAMUSCULAR | Status: DC | PRN

## 2024-01-15 MED ORDER — DEXAMETHASONE SODIUM PHOSPHATE 10 MG/ML IJ SOLN
8.0000 mg | Freq: Once | INTRAMUSCULAR | Status: AC
  Administered 2024-01-15: 8 mg via INTRAVENOUS

## 2024-01-15 MED ORDER — ORAL CARE MOUTH RINSE: 15.0000 mL | Freq: Once | OROMUCOSAL | Status: AC

## 2024-01-15 MED ORDER — LACTATED RINGERS IV BOLUS
250.0000 mL | Freq: Once | INTRAVENOUS | Status: AC
  Administered 2024-01-15: 250 mL via INTRAVENOUS

## 2024-01-15 MED ORDER — CEFAZOLIN SODIUM-DEXTROSE 2-4 GM/100ML-% IV SOLN
2.0000 g | INTRAVENOUS | Status: AC
  Administered 2024-01-15: 2 g via INTRAVENOUS
  Filled 2024-01-15: qty 100

## 2024-01-15 MED ORDER — ONDANSETRON HCL 4 MG/2ML IJ SOLN
INTRAMUSCULAR | Status: DC | PRN
  Administered 2024-01-15: 4 mg via INTRAVENOUS

## 2024-01-15 MED ORDER — PHENYLEPHRINE 80 MCG/ML (10ML) SYRINGE FOR IV PUSH (FOR BLOOD PRESSURE SUPPORT)
PREFILLED_SYRINGE | INTRAVENOUS | Status: DC | PRN
  Administered 2024-01-15 (×2): 120 ug via INTRAVENOUS

## 2024-01-15 MED ORDER — MIDAZOLAM HCL 2 MG/2ML IJ SOLN
1.0000 mg | INTRAMUSCULAR | Status: DC
  Filled 2024-01-15: qty 2

## 2024-01-15 MED ORDER — BUPIVACAINE LIPOSOME 1.3 % IJ SUSP
INTRAMUSCULAR | Status: AC
  Filled 2024-01-15: qty 20

## 2024-01-15 MED ORDER — FENTANYL CITRATE PF 50 MCG/ML IJ SOSY
PREFILLED_SYRINGE | INTRAMUSCULAR | Status: AC
Start: 2024-01-15 — End: 2024-01-15
  Filled 2024-01-15: qty 2

## 2024-01-15 MED ORDER — ONDANSETRON 4 MG PO TBDP: 4.0000 mg | ORAL_TABLET | Freq: Three times a day (TID) | ORAL | 0 refills | Status: DC | PRN

## 2024-01-15 MED ORDER — ACETAMINOPHEN 500 MG PO TABS
1000.0000 mg | ORAL_TABLET | Freq: Once | ORAL | Status: AC
  Administered 2024-01-15: 1000 mg via ORAL
  Filled 2024-01-15: qty 2

## 2024-01-15 MED ORDER — FENTANYL CITRATE PF 50 MCG/ML IJ SOSY
50.0000 ug | PREFILLED_SYRINGE | INTRAMUSCULAR | Status: DC
  Administered 2024-01-15 (×2): 50 ug via INTRAVENOUS

## 2024-01-15 MED ORDER — 0.9 % SODIUM CHLORIDE (POUR BTL) OPTIME
TOPICAL | Status: DC | PRN
Start: 2024-01-15 — End: 2024-01-15
  Administered 2024-01-15: 1000 mL

## 2024-01-15 MED ORDER — HYDROMORPHONE HCL 1 MG/ML IJ SOLN: 0.5000 mg | INTRAMUSCULAR | Status: DC | PRN

## 2024-01-15 MED ORDER — SODIUM CHLORIDE 0.9 % IR SOLN
Status: DC | PRN
  Administered 2024-01-15: 1000 mL

## 2024-01-15 MED ORDER — PHENYLEPHRINE HCL (PRESSORS) 10 MG/ML IV SOLN
INTRAVENOUS | Status: AC
Start: 2024-01-15 — End: 2024-01-15
  Filled 2024-01-15: qty 1

## 2024-01-15 MED ORDER — GABAPENTIN 300 MG PO CAPS
300.0000 mg | ORAL_CAPSULE | Freq: Once | ORAL | Status: AC
  Administered 2024-01-15: 300 mg via ORAL
  Filled 2024-01-15: qty 1

## 2024-01-15 MED ORDER — PROPOFOL 500 MG/50ML IV EMUL
INTRAVENOUS | Status: DC | PRN
  Administered 2024-01-15: 100 ug/kg/min via INTRAVENOUS

## 2024-01-15 MED ORDER — PHENYLEPHRINE HCL (PRESSORS) 10 MG/ML IV SOLN
INTRAVENOUS | Status: AC
  Filled 2024-01-15: qty 1

## 2024-01-15 MED ORDER — GABAPENTIN 300 MG PO CAPS: 300.0000 mg | ORAL_CAPSULE | ORAL | Status: AC

## 2024-01-15 MED ORDER — METHOCARBAMOL 500 MG PO TABS
500.0000 mg | ORAL_TABLET | Freq: Four times a day (QID) | ORAL | Status: DC | PRN
  Administered 2024-01-15: 500 mg via ORAL

## 2024-01-15 MED ORDER — ACETAMINOPHEN 500 MG PO TABS: 1000.0000 mg | ORAL_TABLET | Freq: Once | ORAL | Status: AC

## 2024-01-15 MED ORDER — TRANEXAMIC ACID-NACL 1000-0.7 MG/100ML-% IV SOLN
1000.0000 mg | INTRAVENOUS | Status: AC
  Administered 2024-01-15: 1000 mg via INTRAVENOUS
  Filled 2024-01-15: qty 100

## 2024-01-15 MED ORDER — ASPIRIN 81 MG PO TBEC: 81.0000 mg | DELAYED_RELEASE_TABLET | Freq: Two times a day (BID) | ORAL | 0 refills | Status: AC

## 2024-01-15 MED ORDER — LACTATED RINGERS IV SOLN: INTRAVENOUS | Status: DC

## 2024-01-15 MED ORDER — CELECOXIB 200 MG PO CAPS
200.0000 mg | ORAL_CAPSULE | Freq: Once | ORAL | Status: DC
  Filled 2024-01-15: qty 1

## 2024-01-15 SURGICAL SUPPLY — 47 items
BAG COUNTER SPONGE SURGICOUNT (BAG) IMPLANT
BAG ZIPLOCK 12X15 (MISCELLANEOUS) ×1 IMPLANT
BEARING TIBIAL UKA SZ2 9MM (Orthopedic Implant) IMPLANT
BLADE SAW RECIPROCATING 77.5 (BLADE) ×1 IMPLANT
BLADE SAW SAG 90X13X1.27 (BLADE) ×1 IMPLANT
BNDG ELASTIC 6INX 5YD STR LF (GAUZE/BANDAGES/DRESSINGS) ×1 IMPLANT
BOWL SMART MIX CTS (DISPOSABLE) ×1 IMPLANT
CEMENT BONE SIMPLEX SPEEDSET (Cement) ×1 IMPLANT
COMPONENT FEMUR SZ2 IBALLT MED (Knees) IMPLANT
COVER SURGICAL LIGHT HANDLE (MISCELLANEOUS) ×1 IMPLANT
CUFF TRNQT CYL 34X4.125X (TOURNIQUET CUFF) ×1 IMPLANT
DRAPE INCISE IOBAN 66X45 STRL (DRAPES) ×5 IMPLANT
DRAPE SHEET LG 3/4 BI-LAMINATE (DRAPES) IMPLANT
DRSG AQUACEL AG ADV 3.5X 6 (GAUZE/BANDAGES/DRESSINGS) ×1 IMPLANT
DRSG AQUACEL AG ADV 3.5X10 (GAUZE/BANDAGES/DRESSINGS) IMPLANT
DURAPREP 26ML APPLICATOR (WOUND CARE) ×1 IMPLANT
ELECT PENCIL ROCKER SW 15FT (MISCELLANEOUS) ×1 IMPLANT
ELECT REM PT RETURN 15FT ADLT (MISCELLANEOUS) ×1 IMPLANT
GLOVE BIOGEL M 7.0 STRL (GLOVE) ×1 IMPLANT
GLOVE BIOGEL PI IND STRL 7.5 (GLOVE) ×1 IMPLANT
GLOVE BIOGEL PI IND STRL 8.5 (GLOVE) ×2 IMPLANT
GLOVE SURG ORTHO 8.0 STRL STRW (GLOVE) ×3 IMPLANT
GOWN STRL REUS W/ TWL XL LVL3 (GOWN DISPOSABLE) ×2 IMPLANT
GOWN STRL REUS W/TWL 2XL LVL3 (GOWN DISPOSABLE) ×1 IMPLANT
GUIDE IMPACTOR TIB UKA DISP (ORTHOPEDIC DISPOSABLE SUPPLIES) IMPLANT
GUIDE UKA TIBIAL DISPOSEABLE (DISPOSABLE) IMPLANT
HOLDER FOLEY CATH W/STRAP (MISCELLANEOUS) IMPLANT
HOOD PEEL AWAY T7 (MISCELLANEOUS) ×3 IMPLANT
KIT TURNOVER KIT A (KITS) ×1 IMPLANT
MANIFOLD NEPTUNE II (INSTRUMENTS) ×1 IMPLANT
NS IRRIG 1000ML POUR BTL (IV SOLUTION) ×1 IMPLANT
PACK TOTAL KNEE CUSTOM (KITS) ×1 IMPLANT
SET HNDPC FAN SPRY TIP SCT (DISPOSABLE) ×1 IMPLANT
SPIKE FLUID TRANSFER (MISCELLANEOUS) IMPLANT
STRIP CLOSURE SKIN 1/2X4 (GAUZE/BANDAGES/DRESSINGS) ×1 IMPLANT
SUT MNCRL AB 4-0 PS2 18 (SUTURE) ×1 IMPLANT
SUT STRATAFIX PDS+ 0 24IN (SUTURE) ×1 IMPLANT
SUT VIC AB 0 CT1 36 (SUTURE) ×1 IMPLANT
SUT VIC AB 1 CT1 36 (SUTURE) ×1 IMPLANT
SUTURE STRATFX 0 PDS 27 VIOLET (SUTURE) ×1 IMPLANT
SYR CONTROL 10ML LL (SYRINGE) ×3 IMPLANT
TRAY FOLEY MTR SLVR 14FR STAT (SET/KITS/TRAYS/PACK) IMPLANT
TRAY FOLEY MTR SLVR 16FR STAT (SET/KITS/TRAYS/PACK) ×1 IMPLANT
TRAY TIB CMNT UKA SZ 2 KNEE (Miscellaneous) IMPLANT
TUBE SUCTION HIGH CAP CLEAR NV (SUCTIONS) IMPLANT
WATER STERILE IRR 1000ML POUR (IV SOLUTION) ×1 IMPLANT
WRAP KNEE MAXI GEL POST OP (GAUZE/BANDAGES/DRESSINGS) IMPLANT

## 2024-01-15 NOTE — Evaluation (Signed)
 Physical Therapy Evaluation Patient Details Name: Kaitlyn Maynard MRN: [id number] DOB: 1955/07/22 Today's Date: 01/15/2024  History of Present Illness  Pt is 68 yo female s/p unicompartmental L knee replacement on 01/15/24.  Pt with hx including but not limited to aortic stenosis, arthritis, bradycardia, GERD, R TKA  Clinical Impression  Pt is s/p TKA resulting in the deficits listed below (see PT Problem List). Pt independent at baseline.  She has DME and has arranged for support at home.  Today, pt with good ROM and quad activation.  Sensation intact when checked in supine but with standing reports feet feel spongey.  She was initially unsteady for 2 steps but then significantly improved and able to ambulate 79' and perform stairs.  Educated family on gait belt use for safety.   Pt with good safety awareness.  Provided HEP and pt has outpt PT scheduled.  Pt demonstrates safe gait & transfers in order to return home from PT perspective once discharged by MD.  While in hospital, will continue to benefit from PT for skilled therapy to advance mobility and exercises.           If plan is discharge home, recommend the following: A little help with walking and/or transfers;A little help with bathing/dressing/bathroom   Can travel by private vehicle        Equipment Recommendations None recommended by PT  Recommendations for Other Services       Functional Status Assessment Patient has had a recent decline in their functional status and demonstrates the ability to make significant improvements in function in a reasonable and predictable amount of time.     Precautions / Restrictions Precautions Precautions: Fall;Knee Restrictions Weight Bearing Restrictions Per Provider Order: Yes LLE Weight Bearing Per Provider Order: Weight bearing as tolerated      Mobility  Bed Mobility Overal bed mobility: Needs Assistance Bed Mobility: Supine to Sit, Sit to Supine     Supine to sit:  Supervision Sit to supine: Supervision        Transfers Overall transfer level: Needs assistance Equipment used: Rolling walker (2 wheels) Transfers: Sit to/from Stand Sit to Stand: Contact guard assist           General transfer comment: Stood from bed and toliet; unable to urinate -notified RN    Ambulation/Gait Ambulation/Gait assistance: Contact guard assist Gait Distance (Feet): 60 Feet Assistive device: Rolling walker (2 wheels) Gait Pattern/deviations: Step-to pattern, Decreased stride length Gait velocity: decreased but functioanl     General Gait Details: Cues for RW proximity and pushing RW.  Pt's initial first steps unsteady needing min A but good improvement after 2 steps and did well after standing from toilet and walking.  Did report feet felt spongey once walking - suspect from spinal.  Educated should improve over next few hours.  Encouraged family to use gait belt.  Dtr is nurse from [facility] and agrees  Stairs Stairs: Yes Stairs assistance: Contact guard assist Stair Management: Two rails, Forwards, With walker Number of Stairs: 3 General stair comments: 2 steps with bil rails then top step onto platform with RW to simulate stepping onto porch.  Tolerated well, cued for sequencing  Wheelchair Mobility     Tilt Bed    Modified Rankin (Stroke Patients Only)       Balance Overall balance assessment: Needs assistance Sitting-balance support: No upper extremity supported Sitting balance-Leahy Scale: Good     Standing balance support: Bilateral upper extremity supported Standing balance-Leahy Scale: Poor Standing  balance comment: steady with RW                             Pertinent Vitals/Pain Pain Assessment Pain Assessment: 0-10 Pain Score: 1  Pain Location: L KNee Pain Descriptors / Indicators: Discomfort Pain Intervention(s): Limited activity within patient's tolerance, Monitored during session, Premedicated before session,  Repositioned    Home Living Family/patient expects to be discharged to:: Private residence Living Arrangements: Alone Available Help at Discharge: Family;Available 24 hours/day (daughters staying for several days) Type of Home: House (townhouse) Home Access: Stairs to enter Entrance Stairs-Rails: None Entrance Stairs-Number of Steps: 1   Home Layout: Two level;Able to live on main level with bedroom/bathroom Home Equipment: Rolling Walker (2 wheels);Cane - single point;Grab bars - tub/shower      Prior Function Prior Level of Function : Independent/Modified Independent;Driving             Mobility Comments: could ambulate in community ADLs Comments: independent adls and iadls     Extremity/Trunk Assessment   Upper Extremity Assessment Upper Extremity Assessment: Overall WFL for tasks assessed    Lower Extremity Assessment Lower Extremity Assessment: LLE deficits/detail;RLE deficits/detail RLE Deficits / Details: ROM WFL; MMT 5/5 LLE Deficits / Details: Expected post op changes; ROM knee 0 to 70 degrees; MMT: ankle 5/5, knee and hip at least 3/5 not further tested    Cervical / Trunk Assessment Cervical / Trunk Assessment: Normal  Communication        Cognition Arousal: Alert Behavior During Therapy: WFL for tasks assessed/performed   PT - Cognitive impairments: No apparent impairments                                 Cueing       General Comments  Educated on safe ice use, no pivots, car transfers, resting with leg straight, and TED hose during day. Also, encouraged walking every 1-2 hours during day. Educated on HEP with focus on mobility the first weeks. Discussed doing exercises within pain control and if pain increasing could decreased ROM, reps, and stop exercises as needed. Encouraged to perform quad sets and ankle pumps frequently for blood flow and to promote full knee extension.     Exercises Total Joint Exercises Ankle Circles/Pumps:  AROM, Both, 5 reps, Supine Quad Sets: AROM, Left, 5 reps, Supine Heel Slides: AAROM, Left, 5 reps, Supine Hip ABduction/ADduction: AAROM, Left, 5 reps, Supine Long Arc Quad: AROM, Left, 5 reps, Seated Knee Flexion: AROM, Left, 5 reps, Seated   Assessment/Plan    PT Assessment Patient needs continued PT services  PT Problem List Decreased strength;Decreased mobility;Decreased range of motion;Decreased activity tolerance;Decreased balance;Decreased knowledge of use of DME       PT Treatment Interventions DME instruction;Therapeutic exercise;Gait training;Stair training;Functional mobility training;Therapeutic activities;Patient/family education;Modalities;Balance training    PT Goals (Current goals can be found in the Care Plan section)  Acute Rehab PT Goals Patient Stated Goal: return home PT Goal Formulation: With patient/family Time For Goal Achievement: 01/29/24 Potential to Achieve Goals: Good    Frequency 7X/week     Co-evaluation               AM-PAC PT 6 Clicks Mobility  Outcome Measure Help needed turning from your back to your side while in a flat bed without using bedrails?: A Little Help needed moving from lying on your back to sitting  on the side of a flat bed without using bedrails?: A Little Help needed moving to and from a bed to a chair (including a wheelchair)?: A Little Help needed standing up from a chair using your arms (e.g., wheelchair or bedside chair)?: A Little Help needed to walk in hospital room?: A Little Help needed climbing 3-5 steps with a railing? : A Little 6 Click Score: 18    End of Session Equipment Utilized During Treatment: Gait belt Activity Tolerance: Patient tolerated treatment well Patient left: in bed;with family/visitor present (in PACU) Nurse Communication: Mobility status PT Visit Diagnosis: Other abnormalities of gait and mobility (R26.89);Muscle weakness (generalized) (M62.81)    Time: 8488-8447 PT Time Calculation  (min) (ACUTE ONLY): 41 min   Charges:   PT Evaluation $PT Eval Low Complexity: 1 Low PT Treatments $Gait Training: 8-22 mins $Therapeutic Exercise: 8-22 mins PT General Charges $$ ACUTE PT VISIT: 1 Visit         Benjiman, PT Acute Rehab [facility] [phone]   Benjiman VEAR [place] 01/15/2024, 4:03 PM

## 2024-01-15 NOTE — Anesthesia Procedure Notes (Signed)
 Spinal  Patient location during procedure: OR Start time: 01/15/2024 11:44 AM End time: 01/15/2024 11:50 AM Reason for block: surgical anesthesia Staffing Anesthesiologist: Mallory Manus, MD Performed by: Mallory Manus, MD Authorized by: Mallory Manus, MD   Preanesthetic Checklist Completed: patient identified, IV checked, site marked, risks and benefits discussed, surgical consent, monitors and equipment checked, pre-op evaluation and timeout performed Spinal Block Patient position: sitting Prep: DuraPrep Patient monitoring: heart rate, cardiac monitor, continuous pulse ox and blood pressure Approach: midline Location: L3-4 Injection technique: single-shot Needle Needle type: Sprotte  Needle gauge: 24 G Needle length: 9 cm Assessment Sensory level: T4 Events: CSF return

## 2024-01-15 NOTE — H&P (Signed)
 Kaitlyn Maynard MRN:  [id number] DOB/SEX:  1956/05/19/female  CHIEF COMPLAINT:  Painful left Knee  HISTORY: Patient is a 68 y.o. female presented with a history of pain in the left knee. Onset of symptoms was gradual starting a few years ago with gradually worsening course since that time. Patient has been treated conservatively with over-the-counter NSAIDs and activity modification. Patient currently rates pain in the knee at 10 out of 10 with activity. There is pain at night.  PAST MEDICAL HISTORY: Patient Active Problem List   Diagnosis Date Noted   GERD (gastroesophageal reflux disease) 08/27/2021   Past Medical History:  Diagnosis Date   Aortic stenosis    Arthritis    Bradycardia    GERD (gastroesophageal reflux disease)    Heart murmur    Past Surgical History:  Procedure Laterality Date   CATARACT EXTRACTION Bilateral 10/2019   EYE SURGERY Left 2022   Yag procedure   EYE SURGERY Right 09/28/2021   Yag procedure   REPLACEMENT TOTAL KNEE Right 2007     MEDICATIONS:   No medications prior to admission.    ALLERGIES:  No Known Allergies  REVIEW OF SYSTEMS:  A comprehensive review of systems was negative except for: Musculoskeletal: positive for arthralgias and bone pain   FAMILY HISTORY:   Family History  Problem Relation Age of Onset   Hypertension Mother    Heart disease Mother    Stroke Mother    Heart attack Father    Diabetes Father    Hypertension Father    Heart disease Father    Hypertension Maternal Grandmother    Heart disease Maternal Grandmother     SOCIAL HISTORY:   Social History   Tobacco Use   Smoking status: Never   Smokeless tobacco: Never  Substance Use Topics   Alcohol use: Never     EXAMINATION:  Vital signs in last 24 hours:    There were no vitals taken for this visit.  General Appearance:    Alert, cooperative, no distress, appears stated age  Head:    Normocephalic, without obvious abnormality, atraumatic  Eyes:     PERRL, conjunctiva/corneas clear, EOM's intact, fundi    benign, both eyes  Ears:    Normal TM's and external ear canals, both ears  Nose:   Nares normal, septum midline, mucosa normal, no drainage    or sinus tenderness  Throat:   Lips, mucosa, and tongue normal; teeth and gums normal  Neck:   Supple, symmetrical, trachea midline, no adenopathy;    thyroid :  no enlargement/tenderness/nodules; no carotid   bruit or JVD  Back:     Symmetric, no curvature, ROM normal, no CVA tenderness  Lungs:     Clear to auscultation bilaterally, respirations unlabored  Chest Wall:    No tenderness or deformity   Heart:    Regular rate and rhythm, S1 and S2 normal, no murmur, rub   or gallop  Breast Exam:    No tenderness, masses, or nipple abnormality  Abdomen:     Soft, non-tender, bowel sounds active all four quadrants,    no masses, no organomegaly  Genitalia:    Normal female without lesion, discharge or tenderness  Rectal:    Normal tone,  no masses or tenderness;   guaiac negative stool  Extremities:   Extremities normal, atraumatic, no cyanosis or edema  Pulses:   2+ and symmetric all extremities  Skin:   Skin color, texture, turgor normal, no rashes or lesions  Lymph  nodes:   Cervical, supraclavicular, and axillary nodes normal  Neurologic:   CNII-XII intact, normal strength, sensation and reflexes    throughout    Musculoskeletal:  ROM 0-120, Ligaments intact,  Imaging Review Plain radiographs demonstrate severe degenerative joint disease of the left knee medial compartment. The overall alignment is mild varus. The bone quality appears to be good for age and reported activity level.  Assessment/Plan: Primary osteoarthritis, left knee medial compartment   The patient history, physical examination and imaging studies are consistent with advanced degenerative joint disease of the left knee medial compartment. The patient has failed conservative treatment.  The clearance notes were reviewed.   After discussion with the patient it was felt that unicompartmental Knee Replacement was indicated. The procedure,  risks, and benefits of unicompartmental knee arthroplasty were presented and reviewed. The risks including but not limited to aseptic loosening, infection, blood clots, vascular injury, stiffness, patella tracking problems complications among others were discussed. The patient acknowledged the explanation, agreed to proceed with the plan.  Preoperative templating of the joint replacement has been completed, documented, and submitted to the Operating Room personnel in order to optimize intra-operative equipment management.    Patient's anticipated LOS is less than 2 midnights, meeting these requirements: - Lives within 1 hour of care - Has a competent adult at home to recover with post-op recover - NO history of  - Chronic pain requiring opiods  - Diabetes  - Coronary Artery Disease  - Heart failure  - Heart attack  - Stroke  - DVT/VTE  - Cardiac arrhythmia  - Respiratory Failure/COPD  - Renal failure  - Anemia  - Advanced Liver disease     Kaitlyn Maynard 01/15/2024, 7:00 AM

## 2024-01-15 NOTE — Progress Notes (Signed)
 Orthopedic Tech Progress Note Patient Details:  Kaitlyn Maynard 12-03-1955 [id number]  Ortho Devices Type of Ortho Device: Bone foam zero knee Ortho Device/Splint Interventions: Ordered      [place] 01/15/2024, 3:27 PM

## 2024-01-15 NOTE — Transfer of Care (Signed)
 Immediate Anesthesia Transfer of Care Note  Patient: Kaitlyn Maynard  Procedure(s) Performed: ARTHROPLASTY, KNEE, UNICOMPARTMENTAL (Left: Knee)  Patient Location: PACU  Anesthesia Type:MAC and Spinal  Level of Consciousness: drowsy  Airway & Oxygen Therapy: Patient Spontanous Breathing  Post-op Assessment: Report given to RN  Post vital signs: Reviewed and stable  Last Vitals:  Vitals Value Taken Time  BP 117/63 01/15/24 13:15  Temp    Pulse 64 01/15/24 13:18  Resp 13 01/15/24 13:18  SpO2 100 % 01/15/24 13:18  Vitals shown include unfiled device data.  Last Pain:  Vitals:   01/15/24 0932  TempSrc: Oral  PainSc: 5          Complications: No notable events documented.

## 2024-01-15 NOTE — Anesthesia Procedure Notes (Signed)
 Anesthesia Regional Block: Adductor canal block   Pre-Anesthetic Checklist: , timeout performed,  Correct Patient, Correct Site, Correct Laterality,  Correct Procedure, Correct Position, site marked,  Risks and benefits discussed,  Surgical consent,  Pre-op evaluation,  At surgeon's request and post-op pain management  Laterality: Left  Prep: chloraprep       Needles:  Injection technique: Single-shot  Needle Type: Echogenic Stimulator Needle     Needle Length: 5cm  Needle Gauge: 22     Additional Needles:   Procedures:,,,, ultrasound used (permanent image in chart),,    Narrative:  Start time: 01/15/2024 11:30 AM End time: 01/15/2024 11:35 AM Injection made incrementally with aspirations every 5 mL.  Performed by: Personally  Anesthesiologist: Mallory Manus, MD  Additional Notes: Functioning IV was confirmed and monitors were applied.  A 50mm 22ga Arrow echogenic stimulator needle was used. Sterile prep and drape,hand hygiene and sterile gloves were used. Ultrasound guidance: relevant anatomy identified, needle position confirmed, local anesthetic spread visualized around nerve(s)., vascular puncture avoided.  Image printed for medical record. Negative aspiration and negative test dose prior to incremental administration of local anesthetic. The patient tolerated the procedure well.

## 2024-01-15 NOTE — H&P (Signed)
 Kaitlyn Maynard MRN:  [id number] DOB/SEX:  03-01-1956/female  CHIEF COMPLAINT:  Painful left Knee  HISTORY: Patient is a 68 y.o. female presented with a history of pain in the left knee. Onset of symptoms was gradual starting a few years ago with gradually worsening course since that time. Patient has been treated conservatively with over-the-counter NSAIDs and activity modification. Patient currently rates pain in the knee at 10 out of 10 with activity. There is pain at night.  PAST MEDICAL HISTORY: Patient Active Problem List   Diagnosis Date Noted   GERD (gastroesophageal reflux disease) 08/27/2021   Past Medical History:  Diagnosis Date   Aortic stenosis    Arthritis    Bradycardia    GERD (gastroesophageal reflux disease)    Heart murmur    Past Surgical History:  Procedure Laterality Date   CATARACT EXTRACTION Bilateral 10/2019   EYE SURGERY Left 2022   Yag procedure   EYE SURGERY Right 09/28/2021   Yag procedure   REPLACEMENT TOTAL KNEE Right 2007     MEDICATIONS:   Medications Prior to Admission  Medication Sig Dispense Refill Last Dose/Taking   ascorbic acid (VITAMIN C) 500 MG tablet Take 500 mg by mouth in the morning.   01/08/2024   atorvastatin  (LIPITOR) 20 MG tablet Take 1 tablet (20 mg total) by mouth daily. Patient must keep September  appointment for further refills 60 tablet 0 01/15/2024 Morning   esomeprazole (NEXIUM) 20 MG capsule Take 20 mg by mouth daily before breakfast.   01/15/2024 Morning   Menthol, Topical Analgesic, (BIOFREEZE COLORLESS EX) Apply 1 Application topically 3 (three) times daily as needed (pain.).   01/08/2024   Menthol, Topical Analgesic, (ICY HOT EX) Apply 1 Application topically 3 (three) times daily as needed (pain.).   01/08/2024   Multiple Vitamin (MULTIVITAMIN WITH MINERALS) TABS tablet Take 1 tablet by mouth in the morning. Centrum Multivitamin   01/08/2024   solifenacin (VESICARE) 5 MG tablet Take 5 mg by mouth in the morning.    01/15/2024 Morning   Vitamin D, Cholecalciferol, 25 MCG (1000 UT) TABS Take 1,000 Units by mouth in the morning.   01/08/2024    ALLERGIES:  No Known Allergies  REVIEW OF SYSTEMS:  A comprehensive review of systems was negative except for: Musculoskeletal: positive for arthralgias and bone pain   FAMILY HISTORY:   Family History  Problem Relation Age of Onset   Hypertension Mother    Heart disease Mother    Stroke Mother    Heart attack Father    Diabetes Father    Hypertension Father    Heart disease Father    Hypertension Maternal Grandmother    Heart disease Maternal Grandmother     SOCIAL HISTORY:   Social History   Tobacco Use   Smoking status: Never   Smokeless tobacco: Never  Substance Use Topics   Alcohol use: Never     EXAMINATION:  Vital signs in last 24 hours: Temp:  [98.8 F (37.1 C)] 98.8 F (37.1 C) (08/04 0932) Pulse Rate:  [58] 58 (08/04 0932) Resp:  [16] 16 (08/04 0932) BP: (160)/(77) 160/77 (08/04 0932) SpO2:  [98 %] 98 % (08/04 0932) Weight:  [95 kg] 95 kg (08/04 0932)  BP (!) 160/77   Pulse (!) 58   Temp 98.8 F (37.1 C) (Oral)   Resp 16   Ht 5' 4 (1.626 m)   Wt 95 kg   SpO2 98%   BMI 35.95 kg/m   General Appearance:  Alert, cooperative, no distress, appears stated age  Head:    Normocephalic, without obvious abnormality, atraumatic  Eyes:    PERRL, conjunctiva/corneas clear, EOM's intact, fundi    benign, both eyes  Ears:    Normal TM's and external ear canals, both ears  Nose:   Nares normal, septum midline, mucosa normal, no drainage    or sinus tenderness  Throat:   Lips, mucosa, and tongue normal; teeth and gums normal  Neck:   Supple, symmetrical, trachea midline, no adenopathy;    thyroid :  no enlargement/tenderness/nodules; no carotid   bruit or JVD  Back:     Symmetric, no curvature, ROM normal, no CVA tenderness  Lungs:     Clear to auscultation bilaterally, respirations unlabored  Chest Wall:    No tenderness or  deformity   Heart:    Regular rate and rhythm, S1 and S2 normal, no murmur, rub   or gallop  Breast Exam:    No tenderness, masses, or nipple abnormality  Abdomen:     Soft, non-tender, bowel sounds active all four quadrants,    no masses, no organomegaly  Genitalia:    Normal female without lesion, discharge or tenderness  Rectal:    Normal tone,  no masses or tenderness;   guaiac negative stool  Extremities:   Extremities normal, atraumatic, no cyanosis or edema  Pulses:   2+ and symmetric all extremities  Skin:   Skin color, texture, turgor normal, no rashes or lesions  Lymph nodes:   Cervical, supraclavicular, and axillary nodes normal  Neurologic:   CNII-XII intact, normal strength, sensation and reflexes    throughout    Musculoskeletal:  ROM 0-120, Ligaments intact,  Imaging Review Plain radiographs demonstrate severe degenerative joint disease of the left knee medial compartment. The overall alignment is mild varus. The bone quality appears to be good for age and reported activity level.  Assessment/Plan: Primary osteoarthritis, left knee medial compartment   The patient history, physical examination and imaging studies are consistent with advanced degenerative joint disease of the left knee medial compartment. The patient has failed conservative treatment.  The clearance notes were reviewed.  After discussion with the patient it was felt that unicompartmental Knee Replacement was indicated. The procedure,  risks, and benefits of unicompartmental knee arthroplasty were presented and reviewed. The risks including but not limited to aseptic loosening, infection, blood clots, vascular injury, stiffness, patella tracking problems complications among others were discussed. The patient acknowledged the explanation, agreed to proceed with the plan.  Preoperative templating of the joint replacement has been completed, documented, and submitted to the Operating Room personnel in order to  optimize intra-operative equipment management.    Patient's anticipated LOS is less than 2 midnights, meeting these requirements: - Lives within 1 hour of care - Has a competent adult at home to recover with post-op recover - NO history of  - Chronic pain requiring opiods  - Diabetes  - Coronary Artery Disease  - Heart failure  - Heart attack  - Stroke  - DVT/VTE  - Cardiac arrhythmia  - Respiratory Failure/COPD  - Renal failure  - Anemia  - Advanced Liver disease     Kaitlyn Maynard,STEPHEN D 01/15/2024, 10:38 AM

## 2024-01-15 NOTE — Op Note (Signed)
 UNI KNEE REPLACEMENT OPERATIVE NOTE:  01/15/2024  1:26 PM  PATIENT:  Kaitlyn Maynard  68 y.o. female  PRE-OPERATIVE DIAGNOSIS:  Primary osteoarthritis of left knee M17.12  POST-OPERATIVE DIAGNOSIS:  Primary osteoarthritis of left knee M17.12  PROCEDURE:  Procedure(s): ARTHROPLASTY, KNEE, UNICOMPARTMENTAL  SURGEON:  Surgeon(s): Rubie Kemps, MD  PHYSICIAN ASSISTANT: {Colby Robbins PA-C  ANESTHESIA:   spinal  DRAINS: Hemovac  SPECIMEN: None  COUNTS:  Correct  TOURNIQUET:   Total Tourniquet Time Documented: Thigh (Left) - 36 minutes Total: Thigh (Left) - 36 minutes   DICTATION:  Indication for procedure:    The patient is a 68 y.o. female who has failed conservative treatment for Primary osteoarthritis of left knee M17.12.  Informed consent was obtained prior to anesthesia. The risks versus benefits of the operation were explain and in a way the patient can, and did, understand.    Description of procedure:     The patient was taken to the operating room and placed under anesthesia.  The patient was positioned in the usual fashion taking care that all body parts were adequately padded and/or protected.  I foley catheter was placed.  A tourniquet was applied and the leg prepped and draped in the usual sterile fashion.  The extremity was exsanguinated with the esmarch and tourniquet inflated to 300 mmHg.  Pre-operative range of motion was normal.  The knee was in 5 degree of mild varus.  A midline incision approximately 3-4 inches long was made with a #10 blade.  A new blade was used to make a parapatellar arthrotomy going 1 cm into the quadriceps tendon, over the patella, and alongside the medial aspect of the patellar tendon.  A synovectomy was then performed with the #10 blade and forceps. I then elevated the deep MCL off the medial tibial flare. The knee was put at 90 degrees and the patient specific cutting blocks were used to make our proximal tibial cut and distal  femoral cut. The medial meniscus was removed at this point.  I then used the 2 cutting guide on the femur to drill for lugs and cut the chamfers. Likewise, a 2 tibial baseplate was used to prepare the tibia. I then trialed the  2 femur and 2 tibia. I trialed several poly inserts and a 9 mm achieved good balance in flexion and extension.  I then irrigated copiously and then mixed the cement. I injected exparel  in the deep soft tissues at this point. I then cemented the tibia first followed by the femur and removed excess cement and then inserted the polyethylene. I placed the leg in extension and finished injecting the rest of the exparel . Once the cement was hard, the tourniquet was let down. Hemostasis was obtained. The arthrotomy was closed using a #1 stratofix running suture. The deep soft tissue was closed with #0 vicryls and the subcuticular layer closed with a #2.0 vicryl. The skin was reapproximated and closed using a running 3.0 monocryl. The wound was covered with steristrips, aquacel dressing, and a TED stocking. The patient was awakened, extubated, and taken to recovery in a stable condition.   BLOOD LOSS:  300cc COMPLICATIONS:  None.  PLAN OF CARE: Discharge to home after PACU  PATIENT DISPOSITION:  PACU - hemodynamically stable.   Delay start of Pharmacological VTE agent (>24hrs) due to surgical blood loss or risk of bleeding:  yes  Please fax a copy of this op note to my office at [phone] (please only include page 1 and 2  of the Case Information op note)

## 2024-01-16 NOTE — Anesthesia Postprocedure Evaluation (Signed)
 Anesthesia Post Note  Patient: Maureen LITTIE Capes  Procedure(s) Performed: ARTHROPLASTY, KNEE, UNICOMPARTMENTAL (Left: Knee)     Patient location during evaluation: PACU Anesthesia Type: Regional, MAC and Spinal Level of consciousness: oriented and awake and alert Pain management: pain level controlled Vital Signs Assessment: post-procedure vital signs reviewed and stable Respiratory status: spontaneous breathing, respiratory function stable and patient connected to nasal cannula oxygen Cardiovascular status: blood pressure returned to baseline and stable Postop Assessment: no headache, no backache and no apparent nausea or vomiting Anesthetic complications: no   No notable events documented.  Last Vitals:  Vitals:   01/15/24 1445 01/15/24 1508  BP: (!) 158/94 (!) 168/88  Pulse: 64 (!) 55  Resp: 20 16  Temp:    SpO2: 100% 96%    Last Pain:  Vitals:   01/15/24 1708  TempSrc:   PainSc: 0-No pain                 Lyndol Vanderheiden

## 2024-01-16 NOTE — Addendum Note (Signed)
 Addendum  created 01/16/24 1458 by Mallory Manus, MD   Clinical Note Signed

## 2024-01-16 NOTE — Anesthesia Postprocedure Evaluation (Signed)
 Anesthesia Post Note  Patient: Kaitlyn Maynard  Procedure(s) Performed: ARTHROPLASTY, KNEE, UNICOMPARTMENTAL (Left: Knee)     Patient location during evaluation: PACU Anesthesia Type: Regional, MAC and Spinal Level of consciousness: oriented and awake and alert Pain management: pain level controlled Vital Signs Assessment: post-procedure vital signs reviewed and stable Respiratory status: spontaneous breathing, respiratory function stable and patient connected to nasal cannula oxygen Cardiovascular status: blood pressure returned to baseline and stable Postop Assessment: no headache, no backache and no apparent nausea or vomiting Anesthetic complications: no   No notable events documented.  Last Vitals:  Vitals:   01/15/24 1445 01/15/24 1508  BP: (!) 158/94 (!) 168/88  Pulse: 64 (!) 55  Resp: 20 16  Temp:    SpO2: 100% 96%    Last Pain:  Vitals:   01/15/24 1708  TempSrc:   PainSc: 0-No pain                 Lyndol Vanderheiden

## 2024-01-18 ENCOUNTER — Ambulatory Visit: Admitting: Cardiology

## 2024-01-25 DIAGNOSIS — M62552 Muscle wasting and atrophy, not elsewhere classified, left thigh: Secondary | ICD-10-CM | POA: Diagnosis not present

## 2024-01-25 DIAGNOSIS — Z96652 Presence of left artificial knee joint: Secondary | ICD-10-CM | POA: Diagnosis not present

## 2024-01-25 DIAGNOSIS — R2689 Other abnormalities of gait and mobility: Secondary | ICD-10-CM | POA: Diagnosis not present

## 2024-01-25 DIAGNOSIS — M25562 Pain in left knee: Secondary | ICD-10-CM | POA: Diagnosis not present

## 2024-01-26 ENCOUNTER — Encounter (HOSPITAL_COMMUNITY): Payer: Self-pay | Admitting: Orthopedic Surgery

## 2024-01-29 DIAGNOSIS — M25562 Pain in left knee: Secondary | ICD-10-CM | POA: Diagnosis not present

## 2024-01-29 DIAGNOSIS — R2689 Other abnormalities of gait and mobility: Secondary | ICD-10-CM | POA: Diagnosis not present

## 2024-01-29 DIAGNOSIS — M62552 Muscle wasting and atrophy, not elsewhere classified, left thigh: Secondary | ICD-10-CM | POA: Diagnosis not present

## 2024-02-01 DIAGNOSIS — M62552 Muscle wasting and atrophy, not elsewhere classified, left thigh: Secondary | ICD-10-CM | POA: Diagnosis not present

## 2024-02-01 DIAGNOSIS — M25562 Pain in left knee: Secondary | ICD-10-CM | POA: Diagnosis not present

## 2024-02-01 DIAGNOSIS — R2689 Other abnormalities of gait and mobility: Secondary | ICD-10-CM | POA: Diagnosis not present

## 2024-02-05 DIAGNOSIS — M25562 Pain in left knee: Secondary | ICD-10-CM | POA: Diagnosis not present

## 2024-02-05 DIAGNOSIS — M62552 Muscle wasting and atrophy, not elsewhere classified, left thigh: Secondary | ICD-10-CM | POA: Diagnosis not present

## 2024-02-05 DIAGNOSIS — R2689 Other abnormalities of gait and mobility: Secondary | ICD-10-CM | POA: Diagnosis not present

## 2024-02-09 DIAGNOSIS — R2689 Other abnormalities of gait and mobility: Secondary | ICD-10-CM | POA: Diagnosis not present

## 2024-02-09 DIAGNOSIS — M62552 Muscle wasting and atrophy, not elsewhere classified, left thigh: Secondary | ICD-10-CM | POA: Diagnosis not present

## 2024-02-09 DIAGNOSIS — M25562 Pain in left knee: Secondary | ICD-10-CM | POA: Diagnosis not present

## 2024-02-14 DIAGNOSIS — M25562 Pain in left knee: Secondary | ICD-10-CM | POA: Diagnosis not present

## 2024-02-14 DIAGNOSIS — R2689 Other abnormalities of gait and mobility: Secondary | ICD-10-CM | POA: Diagnosis not present

## 2024-02-14 DIAGNOSIS — M62552 Muscle wasting and atrophy, not elsewhere classified, left thigh: Secondary | ICD-10-CM | POA: Diagnosis not present

## 2024-02-16 DIAGNOSIS — M62552 Muscle wasting and atrophy, not elsewhere classified, left thigh: Secondary | ICD-10-CM | POA: Diagnosis not present

## 2024-02-16 DIAGNOSIS — R2689 Other abnormalities of gait and mobility: Secondary | ICD-10-CM | POA: Diagnosis not present

## 2024-02-16 DIAGNOSIS — M25562 Pain in left knee: Secondary | ICD-10-CM | POA: Diagnosis not present

## 2024-02-19 DIAGNOSIS — R2689 Other abnormalities of gait and mobility: Secondary | ICD-10-CM | POA: Diagnosis not present

## 2024-02-19 DIAGNOSIS — M62552 Muscle wasting and atrophy, not elsewhere classified, left thigh: Secondary | ICD-10-CM | POA: Diagnosis not present

## 2024-02-19 DIAGNOSIS — M25562 Pain in left knee: Secondary | ICD-10-CM | POA: Diagnosis not present

## 2024-02-22 DIAGNOSIS — M25562 Pain in left knee: Secondary | ICD-10-CM | POA: Diagnosis not present

## 2024-02-22 DIAGNOSIS — M62552 Muscle wasting and atrophy, not elsewhere classified, left thigh: Secondary | ICD-10-CM | POA: Diagnosis not present

## 2024-02-22 DIAGNOSIS — R2689 Other abnormalities of gait and mobility: Secondary | ICD-10-CM | POA: Diagnosis not present

## 2024-02-27 DIAGNOSIS — M62552 Muscle wasting and atrophy, not elsewhere classified, left thigh: Secondary | ICD-10-CM | POA: Diagnosis not present

## 2024-02-27 DIAGNOSIS — M25562 Pain in left knee: Secondary | ICD-10-CM | POA: Diagnosis not present

## 2024-02-27 DIAGNOSIS — R2689 Other abnormalities of gait and mobility: Secondary | ICD-10-CM | POA: Diagnosis not present

## 2024-02-29 DIAGNOSIS — R2689 Other abnormalities of gait and mobility: Secondary | ICD-10-CM | POA: Diagnosis not present

## 2024-02-29 DIAGNOSIS — M62552 Muscle wasting and atrophy, not elsewhere classified, left thigh: Secondary | ICD-10-CM | POA: Diagnosis not present

## 2024-02-29 DIAGNOSIS — M25562 Pain in left knee: Secondary | ICD-10-CM | POA: Diagnosis not present

## 2024-03-03 ENCOUNTER — Other Ambulatory Visit: Payer: Self-pay | Admitting: Cardiology

## 2024-03-05 DIAGNOSIS — R2689 Other abnormalities of gait and mobility: Secondary | ICD-10-CM | POA: Diagnosis not present

## 2024-03-05 DIAGNOSIS — M25562 Pain in left knee: Secondary | ICD-10-CM | POA: Diagnosis not present

## 2024-03-05 DIAGNOSIS — M62552 Muscle wasting and atrophy, not elsewhere classified, left thigh: Secondary | ICD-10-CM | POA: Diagnosis not present

## 2024-03-07 DIAGNOSIS — M62552 Muscle wasting and atrophy, not elsewhere classified, left thigh: Secondary | ICD-10-CM | POA: Diagnosis not present

## 2024-03-07 DIAGNOSIS — M25562 Pain in left knee: Secondary | ICD-10-CM | POA: Diagnosis not present

## 2024-03-07 DIAGNOSIS — R2689 Other abnormalities of gait and mobility: Secondary | ICD-10-CM | POA: Diagnosis not present

## 2024-03-11 DIAGNOSIS — E785 Hyperlipidemia, unspecified: Secondary | ICD-10-CM | POA: Insufficient documentation

## 2024-03-11 DIAGNOSIS — I350 Nonrheumatic aortic (valve) stenosis: Secondary | ICD-10-CM | POA: Insufficient documentation

## 2024-03-11 DIAGNOSIS — R001 Bradycardia, unspecified: Secondary | ICD-10-CM | POA: Insufficient documentation

## 2024-03-11 DIAGNOSIS — M1990 Unspecified osteoarthritis, unspecified site: Secondary | ICD-10-CM | POA: Insufficient documentation

## 2024-03-11 DIAGNOSIS — Z1211 Encounter for screening for malignant neoplasm of colon: Secondary | ICD-10-CM | POA: Insufficient documentation

## 2024-03-11 DIAGNOSIS — R011 Cardiac murmur, unspecified: Secondary | ICD-10-CM | POA: Insufficient documentation

## 2024-03-11 DIAGNOSIS — J309 Allergic rhinitis, unspecified: Secondary | ICD-10-CM | POA: Insufficient documentation

## 2024-03-11 NOTE — Progress Notes (Unsigned)
 Cardiology Office Note:    Date:  03/12/2024   ID:  MONTEEN TOOPS, DOB [birth year], MRN [id number]  PCP:  Jefferey Fitch, MD  Cardiologist:  Redell Leiter, MD    Referring MD: Jefferey Fitch, MD    ASSESSMENT:    1. Nonrheumatic aortic valve stenosis   2. Mixed hyperlipidemia    PLAN:    In order of problems listed above:  Stable aortic stenosis recheck echocardiogram in 1 year for progression follow-up in the office initiate endocarditis prophylaxis Continue her current statin she asked me today check labs including CMP and lipid profile   Next appointment: 1 year   Medication Adjustments/Labs and Tests Ordered: Current medicines are reviewed at length with the patient today.  Concerns regarding medicines are outlined above.  Orders Placed This Encounter  Procedures   Comp Met (CMET)   Lipid Profile   ECHOCARDIOGRAM COMPLETE   Meds ordered this encounter  Medications   atorvastatin  (LIPITOR) 20 MG tablet    Sig: Take 1 tablet (20 mg total) by mouth daily.    Dispense:  90 tablet    Refill:  3    Patient must keep upcoming appointment . Nd request   amoxicillin (AMOXIL) 500 MG capsule    Sig: Take 4 tabs = 2,000 mg 1 hour pre-dental procedure    Dispense:  5 capsule    Refill:  2    History of Present Illness:    KAREL MOWERS is a 68 y.o. female with a hx of aortic stenosis and bradycardia last seen 10/04/2022.  Following the visit she had an echocardiogram performed sinus wall thickness EF 55 to 60% and GLS.  Right ventricle was also normal with normal pressure there was left atrial enlargement moderate aortic valve was calcified thickened mild to moderate stenosis with mean gradient of 22 mmHg and mild aortic regurgitation.  Compliance with diet, lifestyle and medications: Yes  She has recovered fully from the surgery.  She is advised endocarditis prophylaxis with her joint prosthesis she should use it with her valve and I gave her the  prescription for amoxicillin No chest pain edema shortness of breath palpitation or syncope Past Medical History:  Diagnosis Date   Allergic rhinitis 03/11/2024   Aortic stenosis    Arthralgia of left knee 04/10/2023   Arthritis    Bradycardia    Encounter for screening for malignant neoplasm of colon 03/11/2024   Gastroesophageal reflux disease 08/27/2021   GERD (gastroesophageal reflux disease)    Heart murmur    Hyperlipidemia 03/11/2024   Morbid obesity (HCC) 03/11/2024   Osteoarthritis of left knee 04/10/2023   Vitamin D deficiency 09/21/2022   Vitamin d low when blood work done .      Current Medications: Current Meds  Medication Sig   amoxicillin (AMOXIL) 500 MG capsule Take 4 tabs = 2,000 mg 1 hour pre-dental procedure   esomeprazole (NEXIUM) 20 MG capsule Take 20 mg by mouth daily before breakfast.   Multiple Vitamin (MULTIVITAMIN WITH MINERALS) TABS tablet Take 1 tablet by mouth in the morning. Centrum Multivitamin   [DISCONTINUED] atorvastatin  (LIPITOR) 20 MG tablet Take 1 tablet (20 mg total) by mouth daily. Patient must keep September  appointment for further refills      EKGs/Labs/Other Studies Reviewed:    The following studies were reviewed today:  Cardiac Studies & Procedures   ______________________________________________________________________________________________     ECHOCARDIOGRAM  ECHOCARDIOGRAM COMPLETE 10/18/2022  Narrative ECHOCARDIOGRAM REPORT    Patient Name:   SAMARIYAH  L Sawatzky Date of Exam: 10/18/2022 Medical Rec #:  [id number]          Height:       64.3 in Accession #:    [id number]         Weight:       226.8 lb Date of Birth:  July 26, 1955          BSA:          2.069 m Patient Age:    66 years           BP:           128/84 mmHg Patient Gender: F                  HR:           50 bpm. Exam Location:  [facility]  Procedure: 2D Echo, Cardiac Doppler, Color Doppler and Strain Analysis  Indications:    Nonrheumatic aortic valve  stenosis [I35.0 (ICD-10-CM)]; Mixed hyperlipidemia [E78.2 (ICD-10-CM)]  History:        Patient has prior history of Echocardiogram examinations, most recent 10/12/2021. Aortic Valve Disease; Risk Factors:Dyslipidemia.  Sonographer:    Charlie Jointer RDCS Referring Phys: [id number] Clementine Soulliere J Dyonna Jaspers  IMPRESSIONS   1. Sigmoid septum noted. Left ventricular ejection fraction, by estimation, is 55 to 60%. The left ventricle has normal function. The left ventricle has no regional wall motion abnormalities. Left ventricular diastolic parameters are consistent with Grade II diastolic dysfunction (pseudonormalization). The average left ventricular global longitudinal strain is -19.3 %. The global longitudinal strain is normal. 2. Right ventricular systolic function is normal. The right ventricular size is normal. There is normal pulmonary artery systolic pressure. 3. Left atrial size was moderately dilated. 4. The mitral valve is normal in structure. No evidence of mitral valve regurgitation. No evidence of mitral stenosis. 5. DI 0.37, SVi 39. The aortic valve is calcified. There is moderate calcification of the aortic valve. There is mild thickening of the aortic valve. Aortic valve regurgitation is mild. Mild to moderate aortic valve stenosis. Aortic valve mean gradient measures 22.2 mmHg. 6. The inferior vena cava is normal in size with greater than 50% respiratory variability, suggesting right atrial pressure of 3 mmHg.  FINDINGS Left Ventricle: Sigmoid septum noted. Left ventricular ejection fraction, by estimation, is 55 to 60%. The left ventricle has normal function. The left ventricle has no regional wall motion abnormalities. The average left ventricular global longitudinal strain is -19.3 %. The global longitudinal strain is normal. The left ventricular internal cavity size was normal in size. There is borderline left ventricular hypertrophy. Left ventricular diastolic parameters are consistent  with Grade II diastolic dysfunction (pseudonormalization).  Right Ventricle: The right ventricular size is normal. No increase in right ventricular wall thickness. Right ventricular systolic function is normal. There is normal pulmonary artery systolic pressure. The tricuspid regurgitant velocity is 2.59 m/s, and with an assumed right atrial pressure of 3 mmHg, the estimated right ventricular systolic pressure is 29.8 mmHg.  Left Atrium: Left atrial size was moderately dilated.  Right Atrium: Right atrial size was normal in size.  Pericardium: There is no evidence of pericardial effusion.  Mitral Valve: The mitral valve is normal in structure. No evidence of mitral valve regurgitation. No evidence of mitral valve stenosis.  Tricuspid Valve: The tricuspid valve is normal in structure. Tricuspid valve regurgitation is mild . No evidence of tricuspid stenosis.  Aortic Valve: DI 0.37, SVi 39. The aortic valve is calcified. There is  moderate calcification of the aortic valve. There is mild thickening of the aortic valve. Aortic valve regurgitation is mild. Aortic regurgitation PHT measures 557 msec. Mild to moderate aortic stenosis is present. Aortic valve mean gradient measures 22.2 mmHg. Aortic valve peak gradient measures 40.6 mmHg. Aortic valve area, by VTI measures 0.94 cm.  Pulmonic Valve: The pulmonic valve was normal in structure. Pulmonic valve regurgitation is not visualized. No evidence of pulmonic stenosis.  Aorta: The aortic root is normal in size and structure.  Venous: The inferior vena cava is normal in size with greater than 50% respiratory variability, suggesting right atrial pressure of 3 mmHg.  IAS/Shunts: No atrial level shunt detected by color flow Doppler.   LEFT VENTRICLE PLAX 2D LVIDd:         3.90 cm   Diastology LVIDs:         2.50 cm   LV e' medial:    6.96 cm/s LV PW:         1.10 cm   LV E/e' medial:  14.3 LV IVS:        1.10 cm   LV e' lateral:   8.81  cm/s LVOT diam:     1.80 cm   LV E/e' lateral: 11.3 LV SV:         81 LV SV Index:   39        2D Longitudinal Strain LVOT Area:     2.54 cm  2D Strain GLS Avg:     -19.3 %   RIGHT VENTRICLE RV Basal diam:  3.40 cm RV Mid diam:    2.30 cm RV S prime:     12.10 cm/s TAPSE (M-mode): 2.4 cm  LEFT ATRIUM              Index        RIGHT ATRIUM           Index LA diam:        3.80 cm  1.84 cm/m   RA Area:     15.50 cm LA Vol (A2C):   62.9 ml  30.40 ml/m  RA Volume:   38.60 ml  18.65 ml/m LA Vol (A4C):   109.0 ml 52.68 ml/m LA Biplane Vol: 82.6 ml  39.92 ml/m AORTIC VALVE                     PULMONIC VALVE AV Area (Vmax):    0.98 cm      PR End Diast Vel: 1.37 msec AV Area (Vmean):   0.95 cm AV Area (VTI):     0.94 cm AV Vmax:           318.75 cm/s AV Vmean:          219.000 cm/s AV VTI:            0.860 m AV Peak Grad:      40.6 mmHg AV Mean Grad:      22.2 mmHg LVOT Vmax:         123.00 cm/s LVOT Vmean:        81.500 cm/s LVOT VTI:          0.318 m LVOT/AV VTI ratio: 0.37 AI PHT:            557 msec  AORTA Ao Root diam: 3.40 cm Ao Asc diam:  3.20 cm  MITRAL VALVE               TRICUSPID VALVE MV Area (  PHT): 3.12 cm    TR Peak grad:   26.8 mmHg MV Decel Time: 243 msec    TR Vmax:        259.00 cm/s MV E velocity: 99.60 cm/s MV A velocity: 95.40 cm/s  SHUNTS MV E/A ratio:  1.04        Systemic VTI:  0.32 m Systemic Diam: 1.80 cm  Lamar Fitch MD Electronically signed by Lamar Fitch MD Signature Date/Time: 10/18/2022/5:38:39 PM    Final    MONITORS  LONG TERM MONITOR (3-14 DAYS) 10/08/2021  Narrative Patch Wear Time:  2 days and 21 hours (2023-04-21T09:29:45-0400 to 2023-04-24T07:05:10-398)  Patient had a min HR of 38 bpm, max HR of 187 bpm, and avg HR of 66 bpm. Predominant underlying rhythm was Sinus Rhythm.  1 run of Ventricular Tachycardia occurred lasting 4 beats with a max rate of 187 bpm (avg 183 bpm).  Isolated VEs were rare (<1.0%), VE  Couplets were rare (<1.0%), and no VE Triplets were present.   2 Supraventricular Tachycardia runs occurred, the run with the fastest interval lasting 15 beats with a max rate of 156 bpm (avg 135 bpm); the run with the fastest interval was also the longest. Isolated SVEs were rare (<1.0%), SVE Couplets were rare (<1.0%), and no SVE Triplets were present.  There were no diary or triggered events.       ______________________________________________________________________________________________          Recent Labs: 01/15/2024: ALT 16; BUN 12; Creatinine, Ser 0.85; Hemoglobin 12.1; Platelets 198; Potassium 3.9; Sodium 134  Recent Lipid Panel    Component Value Date/Time   CHOL 170 12/13/2022 0851   TRIG 69 12/13/2022 0851   HDL 74 12/13/2022 0851   CHOLHDL 2.3 12/13/2022 0851   LDLCALC 83 12/13/2022 0851    Physical Exam:    VS:  BP (!) 154/92   Pulse 74   Ht 5' 5 (1.651 m)   Wt 206 lb 9.6 oz (93.7 kg)   SpO2 99%   BMI 34.38 kg/m     Wt Readings from Last 3 Encounters:  03/12/24 206 lb 9.6 oz (93.7 kg)  01/15/24 209 lb 7 oz (95 kg)  01/02/24 210 lb (95.3 kg)     GEN:  Well nourished, well developed in no acute distress HEENT: Normal NECK: No JVD; No carotid bruits LYMPHATICS: No lymphadenopathy CARDIAC: 2-3 of 6 outflow murmur does radiate to the carotids RRR, no murmurs, rubs, gallops RESPIRATORY:  Clear to auscultation without rales, wheezing or rhonchi  ABDOMEN: Soft, non-tender, non-distended MUSCULOSKELETAL:  No edema; No deformity  SKIN: Warm and dry NEUROLOGIC:  Alert and oriented x 3 PSYCHIATRIC:  Normal affect    Signed, Redell Leiter, MD  03/12/2024 2:41 PM    [facility]

## 2024-03-12 ENCOUNTER — Encounter: Payer: Self-pay | Admitting: Cardiology

## 2024-03-12 ENCOUNTER — Ambulatory Visit: Attending: Cardiology | Admitting: Cardiology

## 2024-03-12 VITALS — BP 154/92 | HR 74 | Ht 65.0 in | Wt 206.6 lb

## 2024-03-12 DIAGNOSIS — I350 Nonrheumatic aortic (valve) stenosis: Secondary | ICD-10-CM | POA: Diagnosis not present

## 2024-03-12 DIAGNOSIS — E782 Mixed hyperlipidemia: Secondary | ICD-10-CM | POA: Diagnosis not present

## 2024-03-12 MED ORDER — AMOXICILLIN 500 MG PO CAPS: ORAL_CAPSULE | ORAL | 2 refills | Status: AC

## 2024-03-12 MED ORDER — ATORVASTATIN CALCIUM 20 MG PO TABS: 20.0000 mg | ORAL_TABLET | Freq: Every day | ORAL | 3 refills | Status: AC

## 2024-03-12 NOTE — Patient Instructions (Signed)
 Medication Instructions:  Your physician has recommended you make the following change in your medication:   START: Amoxicillin 500 mg (Take 4 tablets = 2,000 mg 1 hour pre-dental procedure)  *If you need a refill on your cardiac medications before your next appointment, please call your pharmacy*  Lab Work: Your physician recommends that you return for lab work in:   Labs today: CMP, Lipids  If you have labs (blood work) drawn today and your tests are completely normal, you will receive your results only by: MyChart Message (if you have MyChart) OR A paper copy in the mail If you have any lab test that is abnormal or we need to change your treatment, we will call you to review the results.  Testing/Procedures: Your physician has requested that you have an echocardiogram. Echocardiography is a painless test that uses sound waves to create images of your heart. It provides your doctor with information about the size and shape of your heart and how well your heart's chambers and valves are working. This procedure takes approximately one hour. There are no restrictions for this procedure. Please do NOT wear cologne, perfume, aftershave, or lotions (deodorant is allowed). Please arrive 15 minutes prior to your appointment time.  Please note: We ask at that you not bring children with you during ultrasound (echo/ vascular) testing. Due to room size and safety concerns, children are not allowed in the ultrasound rooms during exams. Our front office staff cannot provide observation of children in our lobby area while testing is being conducted. An adult accompanying a patient to their appointment will only be allowed in the ultrasound room at the discretion of the ultrasound technician under special circumstances. We apologize for any inconvenience.   Follow-Up: At [facility], you and your health needs are our priority.  As part of our continuing mission to provide you with exceptional  heart care, our providers are all part of one team.  This team includes your primary Cardiologist (physician) and Advanced Practice Providers or APPs (Physician Assistants and Nurse Practitioners) who all work together to provide you with the care you need, when you need it.  Your next appointment:   1 year(s)  Provider:   Redell Leiter, MD    We recommend signing up for the patient portal called MyChart.  Sign up information is provided on this After Visit Summary.  MyChart is used to connect with patients for Virtual Visits (Telemedicine).  Patients are able to view lab/test results, encounter notes, upcoming appointments, etc.  Non-urgent messages can be sent to your provider as well.   To learn more about what you can do with MyChart, go to [email or web address].   Other Instructions None

## 2024-03-13 DIAGNOSIS — R2689 Other abnormalities of gait and mobility: Secondary | ICD-10-CM | POA: Diagnosis not present

## 2024-03-13 DIAGNOSIS — M62552 Muscle wasting and atrophy, not elsewhere classified, left thigh: Secondary | ICD-10-CM | POA: Diagnosis not present

## 2024-03-13 DIAGNOSIS — M25562 Pain in left knee: Secondary | ICD-10-CM | POA: Diagnosis not present

## 2024-03-13 LAB — LIPID PANEL
Chol/HDL Ratio: 2.2 ratio (ref 0.0–4.4)
Cholesterol, Total: 188 mg/dL (ref 100–199)
HDL: 84 mg/dL (ref >39)
LDL Chol Calc (NIH): 90 mg/dL (ref 0–99)
Triglycerides: 79 mg/dL (ref 0–149)
VLDL Cholesterol Cal: 14 mg/dL (ref 5–40)

## 2024-03-13 LAB — COMPREHENSIVE METABOLIC PANEL WITH GFR
ALT: 22 IU/L (ref 0–32)
AST: 26 IU/L (ref 0–40)
Albumin: 4.4 g/dL (ref 3.9–4.9)
Alkaline Phosphatase: 80 IU/L (ref 49–135)
BUN/Creatinine Ratio: 17 (ref 12–28)
BUN: 14 mg/dL (ref 8–27)
Bilirubin Total: 0.5 mg/dL (ref 0.0–1.2)
CO2: 21 mmol/L (ref 20–29)
Calcium: 10.4 mg/dL — ABNORMAL HIGH (ref 8.7–10.3)
Chloride: 101 mmol/L (ref 96–106)
Creatinine, Ser: 0.82 mg/dL (ref 0.57–1.00)
Globulin, Total: 1.9 g/dL (ref 1.5–4.5)
Glucose: 83 mg/dL (ref 70–99)
Potassium: 4.4 mmol/L (ref 3.5–5.2)
Sodium: 138 mmol/L (ref 134–144)
Total Protein: 6.3 g/dL (ref 6.0–8.5)
eGFR: 77 mL/min/1.73 (ref >59)

## 2024-03-14 DIAGNOSIS — R350 Frequency of micturition: Secondary | ICD-10-CM | POA: Diagnosis not present

## 2024-03-14 DIAGNOSIS — N3000 Acute cystitis without hematuria: Secondary | ICD-10-CM | POA: Diagnosis not present

## 2024-03-14 DIAGNOSIS — R300 Dysuria: Secondary | ICD-10-CM | POA: Diagnosis not present

## 2024-03-15 ENCOUNTER — Ambulatory Visit: Payer: Self-pay

## 2024-03-15 ENCOUNTER — Other Ambulatory Visit: Payer: Self-pay

## 2024-03-15 DIAGNOSIS — E782 Mixed hyperlipidemia: Secondary | ICD-10-CM

## 2024-03-15 MED ORDER — EZETIMIBE 10 MG PO TABS: 10.0000 mg | ORAL_TABLET | Freq: Every day | ORAL | 3 refills | Status: AC

## 2024-03-15 NOTE — Telephone Encounter (Signed)
 Patient jsut called to confirm she can do a walk in for lab work. I confirmed--just FYI.

## 2024-03-20 DIAGNOSIS — M25562 Pain in left knee: Secondary | ICD-10-CM | POA: Diagnosis not present

## 2024-03-20 DIAGNOSIS — M62552 Muscle wasting and atrophy, not elsewhere classified, left thigh: Secondary | ICD-10-CM | POA: Diagnosis not present

## 2024-03-20 DIAGNOSIS — R2689 Other abnormalities of gait and mobility: Secondary | ICD-10-CM | POA: Diagnosis not present

## 2024-03-21 DIAGNOSIS — K0889 Other specified disorders of teeth and supporting structures: Secondary | ICD-10-CM | POA: Diagnosis not present

## 2024-03-21 DIAGNOSIS — H00015 Hordeolum externum left lower eyelid: Secondary | ICD-10-CM | POA: Diagnosis not present

## 2024-03-27 DIAGNOSIS — M25562 Pain in left knee: Secondary | ICD-10-CM | POA: Diagnosis not present

## 2024-03-27 DIAGNOSIS — R2689 Other abnormalities of gait and mobility: Secondary | ICD-10-CM | POA: Diagnosis not present

## 2024-03-27 DIAGNOSIS — M62552 Muscle wasting and atrophy, not elsewhere classified, left thigh: Secondary | ICD-10-CM | POA: Diagnosis not present

## 2024-04-03 ENCOUNTER — Other Ambulatory Visit: Payer: Self-pay | Admitting: Internal Medicine

## 2024-04-03 DIAGNOSIS — Z1231 Encounter for screening mammogram for malignant neoplasm of breast: Secondary | ICD-10-CM

## 2024-04-03 DIAGNOSIS — M62552 Muscle wasting and atrophy, not elsewhere classified, left thigh: Secondary | ICD-10-CM | POA: Diagnosis not present

## 2024-04-03 DIAGNOSIS — R2689 Other abnormalities of gait and mobility: Secondary | ICD-10-CM | POA: Diagnosis not present

## 2024-04-03 DIAGNOSIS — M25562 Pain in left knee: Secondary | ICD-10-CM | POA: Diagnosis not present

## 2024-04-08 DIAGNOSIS — R2689 Other abnormalities of gait and mobility: Secondary | ICD-10-CM | POA: Diagnosis not present

## 2024-04-08 DIAGNOSIS — M25562 Pain in left knee: Secondary | ICD-10-CM | POA: Diagnosis not present

## 2024-04-08 DIAGNOSIS — M62552 Muscle wasting and atrophy, not elsewhere classified, left thigh: Secondary | ICD-10-CM | POA: Diagnosis not present

## 2024-04-10 DIAGNOSIS — M25562 Pain in left knee: Secondary | ICD-10-CM | POA: Diagnosis not present

## 2024-04-10 DIAGNOSIS — M62552 Muscle wasting and atrophy, not elsewhere classified, left thigh: Secondary | ICD-10-CM | POA: Diagnosis not present

## 2024-04-10 DIAGNOSIS — R2689 Other abnormalities of gait and mobility: Secondary | ICD-10-CM | POA: Diagnosis not present

## 2024-05-20 ENCOUNTER — Ambulatory Visit

## 2024-06-20 ENCOUNTER — Ambulatory Visit
Admission: RE | Admit: 2024-06-20 | Discharge: 2024-06-20 | Disposition: A | Discharge status: Home / Self Care | Source: Ambulatory Visit | Attending: Internal Medicine | Admitting: Internal Medicine

## 2024-06-20 DIAGNOSIS — Z1231 Encounter for screening mammogram for malignant neoplasm of breast: Secondary | ICD-10-CM

## 2024-07-11 ENCOUNTER — Encounter: Payer: Self-pay | Admitting: Cardiology

## 2024-08-19 ENCOUNTER — Ambulatory Visit
